# Patient Record
Sex: Female | Born: 1975 | Race: Black or African American | Hispanic: No | Marital: Married | Smoking: Never smoker
Health system: Southern US, Community
[De-identification: ages and names within clinical notes are randomized; demographics above are authoritative.]

## PROBLEM LIST (undated history)

## (undated) ENCOUNTER — Inpatient Hospital Stay (HOSPITAL_COMMUNITY): Payer: Self-pay

## (undated) DIAGNOSIS — R0602 Shortness of breath: Secondary | ICD-10-CM

## (undated) DIAGNOSIS — Z8619 Personal history of other infectious and parasitic diseases: Secondary | ICD-10-CM

## (undated) DIAGNOSIS — O139 Gestational [pregnancy-induced] hypertension without significant proteinuria, unspecified trimester: Secondary | ICD-10-CM

## (undated) DIAGNOSIS — E611 Iron deficiency: Secondary | ICD-10-CM

## (undated) DIAGNOSIS — I209 Angina pectoris, unspecified: Secondary | ICD-10-CM

## (undated) DIAGNOSIS — C801 Malignant (primary) neoplasm, unspecified: Secondary | ICD-10-CM

## (undated) DIAGNOSIS — E669 Obesity, unspecified: Secondary | ICD-10-CM

## (undated) DIAGNOSIS — K589 Irritable bowel syndrome without diarrhea: Secondary | ICD-10-CM

## (undated) DIAGNOSIS — D649 Anemia, unspecified: Secondary | ICD-10-CM

## (undated) DIAGNOSIS — I8390 Asymptomatic varicose veins of unspecified lower extremity: Secondary | ICD-10-CM

## (undated) DIAGNOSIS — O24419 Gestational diabetes mellitus in pregnancy, unspecified control: Secondary | ICD-10-CM

## (undated) HISTORY — DX: Personal history of other infectious and parasitic diseases: Z86.19

## (undated) HISTORY — DX: Gestational diabetes mellitus in pregnancy, unspecified control: O24.419

## (undated) HISTORY — DX: Anemia, unspecified: D64.9

## (undated) HISTORY — DX: Obesity, unspecified: E66.9

## (undated) HISTORY — DX: Angina pectoris, unspecified: I20.9

## (undated) HISTORY — PX: VULVA SURGERY: SHX837

## (undated) HISTORY — DX: Asymptomatic varicose veins of unspecified lower extremity: I83.90

## (undated) HISTORY — DX: Shortness of breath: R06.02

## (undated) HISTORY — DX: Iron deficiency: E61.1

## (undated) HISTORY — DX: Irritable bowel syndrome, unspecified: K58.9

---

## 2005-07-26 ENCOUNTER — Observation Stay (HOSPITAL_COMMUNITY): Admission: AD | Admit: 2005-07-26 | Discharge: 2005-07-27 | Payer: Self-pay | Admitting: Obstetrics & Gynecology

## 2005-09-12 ENCOUNTER — Ambulatory Visit (HOSPITAL_COMMUNITY): Admission: RE | Admit: 2005-09-12 | Discharge: 2005-09-12 | Payer: Self-pay | Admitting: Obstetrics & Gynecology

## 2005-12-28 ENCOUNTER — Inpatient Hospital Stay (HOSPITAL_COMMUNITY): Admission: AD | Admit: 2005-12-28 | Discharge: 2005-12-28 | Payer: Self-pay | Admitting: Obstetrics & Gynecology

## 2006-01-01 ENCOUNTER — Encounter: Payer: Self-pay | Admitting: Vascular Surgery

## 2006-01-01 ENCOUNTER — Ambulatory Visit (HOSPITAL_COMMUNITY): Admission: RE | Admit: 2006-01-01 | Discharge: 2006-01-01 | Payer: Self-pay | Admitting: Obstetrics & Gynecology

## 2006-01-14 ENCOUNTER — Inpatient Hospital Stay (HOSPITAL_COMMUNITY): Admission: AD | Admit: 2006-01-14 | Discharge: 2006-01-14 | Payer: Self-pay | Admitting: Obstetrics & Gynecology

## 2006-01-27 ENCOUNTER — Inpatient Hospital Stay (HOSPITAL_COMMUNITY): Admission: AD | Admit: 2006-01-27 | Discharge: 2006-01-29 | Payer: Self-pay | Admitting: Obstetrics & Gynecology

## 2008-01-15 ENCOUNTER — Emergency Department (HOSPITAL_COMMUNITY): Admission: EM | Admit: 2008-01-15 | Discharge: 2008-01-15 | Payer: Self-pay | Admitting: Emergency Medicine

## 2010-12-21 NOTE — H&P (Signed)
NAMETICHINA, KOEBEL NO.:  [id number]   MEDICAL RECORD NO.:  [id number]          PATIENT TYPE:  OBV   LOCATION:  9303                          FACILITY:  [facility]   PHYSICIAN:  Roseanna Rainbow, M.D.DATE OF BIRTH:  12-31-1975   DATE OF ADMISSION:  07/26/2005  DATE OF DISCHARGE:                                HISTORY & PHYSICAL   CHIEF COMPLAINT:  The patient is a 35 year old gravida 1 para 0 with an  estimated date of confinement of January 28, 2006, with an intrauterine  pregnancy of 13+ weeks, complaining of nausea, vomiting, and diarrhea.   HISTORY OF PRESENT ILLNESS:  The symptoms began several hours prior to  presentation.   PAST GYN HISTORY:  Pap smear normal, April 2006.  No sexually transmitted  infections.   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   PAST SURGICAL HISTORY:  She has had chin surgery and repair of a laceration  right flank.   DRUG ALLERGIES:  PREDNISONE.   MEDICATIONS:  Prenatal vitamins.   PHYSICAL EXAMINATION:  VITAL SIGNS:  Temperature 100.5; pulse 120;  respirations 20; blood pressure 116/75.  Orthostatics:  Orthostatic by  pulse.  GENERAL:  Nontoxic appearing.  ABDOMEN:  Soft, nontender.  PELVIC:  Deferred.   ASSESSMENT:  Early pregnant viral gastroenteritis with dehydration.   PLAN:  23-hour observation.  Will check a CBC, electrolytes.  IV hydration,  antiemetics.      Roseanna Rainbow, M.D.  Electronically Signed     LAJ/MEDQ  D:  07/27/2005  T:  07/27/2005  Job:  [id number]

## 2010-12-21 NOTE — H&P (Signed)
NAMELEYLANIE, WOODMANSEE NO.:  [id number]   MEDICAL RECORD NO.:  [id number]          PATIENT TYPE:  INP   LOCATION:  9162                          FACILITY:  [facility]   PHYSICIAN:  Roseanna Rainbow, M.D.DATE OF BIRTH:  [birth year]   DATE OF ADMISSION:  01/27/2006  DATE OF DISCHARGE:                                HISTORY & PHYSICAL   CHIEF COMPLAINT:  The patient is a 35 year old gravida 1, para 0 with an  estimated date of confinement of June 26 with an intrauterine pregnancy of  39+ weeks, complaining of ruptured membranes and contractions.   HISTORY OF PRESENT ILLNESS:  Please see the above. The patient reports  rupture of membranes with green-tinged fluid several hours to presentation.   ALLERGIES:  PREDNISONE.   MEDICATIONS:  Prenatal vitamins.   RISK FACTORS:  Gestational hypertension.   LABORATORY DATA:  Hemoglobin 11, hematocrit 33.6. Chlamydia negative. GC  negative. Urine culture and sensitivity is significant growth. GBS negative.  One-hour GTT 157. Three-hour GTT normal. HIV nonreactive. Blood type O  positive, antibody screen negative. Hepatitis B surface antigen negative.  RPR nonreactive. Rubella immune. Sickle cell negative.   PAST GYNECOLOGICAL HISTORY:  Noncontributory.   PAST MEDICAL HISTORY:  No significant history of medical diseases.   PAST SURGICAL HISTORY:  No previous surgery.   SOCIAL HISTORY:  Does not give any significant history of alcohol usage. Has  no significant smoking history. Denies illicit drug use.   FAMILY HISTORY:  Chronic hypertension, diabetes mellitus.   PHYSICAL EXAMINATION:  VITAL SIGNS:  Blood pressure 140s/80s to 90s,  afebrile, fetal heart tracing reassuring. Tocodynamometer:  Irregular  uterine contractions.  STERILE VAGINAL EXAM:  Cervix is 2 cm dilated, 80% effaced.   ASSESSMENT:  Intrauterine pregnancy at term with gestational hypertension,  meconium-stained fluid.   PLAN:  Admission. Likely  augmentation of labor with low-dose Pitocin. Will  check a PIH panel.      Roseanna Rainbow, M.D.  Electronically Signed     LAJ/MEDQ  D:  01/27/2006  T:  01/27/2006  Job:  [id number]

## 2012-03-11 ENCOUNTER — Ambulatory Visit (INDEPENDENT_AMBULATORY_CARE_PROVIDER_SITE_OTHER): Payer: Medicaid Other | Admitting: Obstetrics and Gynecology

## 2012-03-11 DIAGNOSIS — O26849 Uterine size-date discrepancy, unspecified trimester: Secondary | ICD-10-CM

## 2012-03-11 DIAGNOSIS — Z331 Pregnant state, incidental: Secondary | ICD-10-CM

## 2012-03-11 LAB — POCT URINALYSIS DIPSTICK
Bilirubin, UA: NEGATIVE
Blood, UA: NEGATIVE
Nitrite, UA: NEGATIVE
Spec Grav, UA: 1.025
Urobilinogen, UA: NEGATIVE

## 2012-03-11 NOTE — Progress Notes (Signed)
Pt declines genetic testing. Dating U/S sched at [facility] W/U 04/02/12.  Declined earlier appt.

## 2012-03-12 LAB — PRENATAL PANEL VII
Antibody Screen: NEGATIVE
Basophils Absolute: 0 10*3/uL (ref 0.0–0.1)
Basophils Relative: 1 % (ref 0–1)
Eosinophils Relative: 2 % (ref 0–5)
Hemoglobin: 11.9 g/dL — ABNORMAL LOW (ref 12.0–15.0)
MCV: 85.5 fL (ref 78.0–100.0)
Monocytes Absolute: 0.6 10*3/uL (ref 0.1–1.0)
Monocytes Relative: 9 % (ref 3–12)
RBC: 4.15 MIL/uL (ref 3.87–5.11)
WBC: 5.9 10*3/uL (ref 4.0–10.5)

## 2012-03-13 ENCOUNTER — Telehealth: Payer: Self-pay | Admitting: Obstetrics and Gynecology

## 2012-03-13 LAB — HEMOGLOBINOPATHY EVALUATION
Hgb A2 Quant: 2.6 % (ref 2.2–3.2)
Hgb A: 97.4 % (ref 96.8–97.8)
Hgb F Quant: 0 % (ref 0.0–2.0)
Hgb S Quant: 0 %

## 2012-03-13 NOTE — Telephone Encounter (Signed)
TRIAGE/OB °

## 2012-03-13 NOTE — Telephone Encounter (Signed)
Pt called, is 7 wks, states is experiencing some brown spotting today and yesterday.  Spotting started the morning after she had intercourse 2 days ago.  Pt denies any cramping, has a pad on, is not seeing any of the spotting on the pad, just when she wipes.  Per DD advised pt to continue monitoring for next few hours.  If spotting changes to bright red bleeding, starts to spill out on the pad, accompanied with cramping to call office.  Pt also advised if in the next 24 hours does not change, continuing to spot to call after hours over the weekend.  Pt voices understanding.

## 2012-03-24 ENCOUNTER — Telehealth: Payer: Self-pay | Admitting: Obstetrics and Gynecology

## 2012-03-24 NOTE — Telephone Encounter (Signed)
Pt 8-[redacted] wk gestation, called complaining of Dizziness and nausea. Pt was advised to follow protocol for nausea that was given at time of ob interview(booklet). Pt was advised to drink plenty of fluids and eat more protein. Pt had only been eating small portions of fruit and salad only, Pt was advised to call the office if symptoms worsen. Jenny Marshall

## 2012-04-02 ENCOUNTER — Ambulatory Visit (INDEPENDENT_AMBULATORY_CARE_PROVIDER_SITE_OTHER): Payer: Medicaid Other | Admitting: Obstetrics and Gynecology

## 2012-04-02 ENCOUNTER — Encounter: Payer: Self-pay | Admitting: Obstetrics and Gynecology

## 2012-04-02 ENCOUNTER — Ambulatory Visit (INDEPENDENT_AMBULATORY_CARE_PROVIDER_SITE_OTHER): Payer: Medicaid Other

## 2012-04-02 VITALS — BP 110/64 | Ht 69.0 in | Wt 238.5 lb

## 2012-04-02 DIAGNOSIS — O26849 Uterine size-date discrepancy, unspecified trimester: Secondary | ICD-10-CM

## 2012-04-02 DIAGNOSIS — R638 Other symptoms and signs concerning food and fluid intake: Secondary | ICD-10-CM | POA: Insufficient documentation

## 2012-04-02 DIAGNOSIS — K117 Disturbances of salivary secretion: Secondary | ICD-10-CM

## 2012-04-02 DIAGNOSIS — Z8632 Personal history of gestational diabetes: Secondary | ICD-10-CM

## 2012-04-02 DIAGNOSIS — N90810 Female genital mutilation status, unspecified: Secondary | ICD-10-CM | POA: Insufficient documentation

## 2012-04-02 DIAGNOSIS — O09299 Supervision of pregnancy with other poor reproductive or obstetric history, unspecified trimester: Secondary | ICD-10-CM | POA: Insufficient documentation

## 2012-04-02 DIAGNOSIS — Z331 Pregnant state, incidental: Secondary | ICD-10-CM

## 2012-04-02 LAB — OB RESULTS CONSOLE GC/CHLAMYDIA
Chlamydia: NEGATIVE
Gonorrhea: NEGATIVE

## 2012-04-02 LAB — POCT URINALYSIS DIPSTICK
Leukocytes, UA: NEGATIVE
Spec Grav, UA: >=1.03
pH, UA: 5

## 2012-04-02 LAB — US OB COMP LESS 14 WKS

## 2012-04-02 NOTE — Progress Notes (Signed)
[place] today:  10 4/7 weeks, c/w dates, SIUP.  FHR 156 Simple cyst on RV, 4.2 x 2.7 x 3.8

## 2012-04-02 NOTE — Progress Notes (Signed)
Subjective:    Jenny Marshall is being seen today for her first obstetrical visit at [redacted]w[redacted]d gestation by LMP, and is in agreement with US done today for dating. 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  Plans non-medicated birth--had severe tearing with 1st delivery, with 2 1/2 hour repair per patient.  Hopes to avoid issue this delivery.  Had episiotomy last delivery.  Tends to push very vigorously, with rapid 2nd stage labor  .  She reports nausa, vomiting, and ptyalism, but declines meds.  Also has some dizziness with position changes.  Her obstetrical history is significant for: Patient Active Problem List  Diagnosis  . Female circumcision  . Hx of gestational diabetes in prior pregnancy, currently pregnant  . Increased BMI  . Perineal/vulvar trauma with delivery  . Ptyalism    Relationship with FOB:  Married, of the Muslim faith.  Patient does intend to breast feed.   Pregnancy history fully reviewed.  The following portions of the patient's history were reviewed and updated as appropriate: allergies, current medications, past family history, past medical history, past social history, past surgical history and problem list.  Review of Systems Pertinent ROS is described in HPI   Objective:   BP 110/64  Ht 5\' 9"  (1.753 m)  Wt 238 lb 8 oz (108.183 kg)  BMI 35.22 kg/m2  LMP 01/20/2012 Wt Readings from Last 1 Encounters:  04/02/12 238 lb 8 oz (108.183 kg)   BMI: Body mass index is 35.22 kg/(m^2).  General: alert, cooperative and no distress Respiratory: clear to auscultation bilaterally Cardiovascular: regular rate and rhythm, S1, S2 normal, no murmur Breasts:  No dominant masses, nipples erect Gastrointestinal: soft, non-tender; no masses,  no organomegaly Extremities: extremities normal, no pain or edema Vaginal Bleeding: None  EXTERNAL GENITALIA: normal appearing vulva with no masses, tenderness or lesions VAGINA: no abnormal discharge or  lesions CERVIX: no lesions or cervical motion tenderness; cervix closed, long, firm UTERUS: gravid and consistent with 10-11 weeks ADNEXA: no masses palpable and nontender OB EXAM PELVIMETRY: appears adequate Thin band of tissue at previous site of clitoris.  Perineal body intact.   Introitus admits 2 fingers fully, with mild tenderness.   FHR:  160  bpm  Assessment:    Pregnancy at  10 3/7 weeks Female circumcision Hx perineal trauma with 1st birth Hx GDM last pregnancy Plan:     Prenatal panel reviewed and discussed with the patient:yes Pap smear collected:yes GC/Chlamydia collected:yes Wet prep:  WNL Discussion of Genetic testing options: Declines Prenatal vitamins recommended Problem list reviewed and updated.  Plan of care: Follow up in 4 weeks for ROB--would like to see SR at that visit. [place] for anatomy at 18 weeks Early glucola at 18 weeks Discussed issue of previous perineal trauma--reassured patient we would do all we can to assist in protecting the perineum at delivery, including evaluating need/option for episiotomy prn.  Nigel Bridgeman CNM, [place] 04/02/2012 2:44 PM

## 2012-04-02 NOTE — Progress Notes (Signed)
Pt states she had to stop PNV C/O: spitting, being dizzy, fatigue, and weakness Pt states she is drinking plenty of water 8-10 glasses a day. Last pap was 4 year ago. Pt declines genetic testing.

## 2012-04-07 ENCOUNTER — Encounter: Payer: Self-pay | Admitting: Obstetrics and Gynecology

## 2012-04-07 LAB — PAP IG, CT-NG, RFX HPV ASCU: GC Probe Amp: NEGATIVE

## 2012-04-30 ENCOUNTER — Ambulatory Visit (INDEPENDENT_AMBULATORY_CARE_PROVIDER_SITE_OTHER): Payer: Medicaid Other | Admitting: Obstetrics and Gynecology

## 2012-04-30 ENCOUNTER — Encounter: Payer: Medicaid Other | Admitting: Obstetrics and Gynecology

## 2012-04-30 VITALS — BP 110/58 | Wt 237.0 lb

## 2012-04-30 DIAGNOSIS — Z331 Pregnant state, incidental: Secondary | ICD-10-CM

## 2012-04-30 DIAGNOSIS — Z3689 Encounter for other specified antenatal screening: Secondary | ICD-10-CM

## 2012-04-30 NOTE — Progress Notes (Signed)
[redacted]w[redacted]d Prenatal labs reviewed with patient. Genetic screening discussed:quad screen declined [place] in 4 weeks with early glucola and anatomy ultrasound

## 2012-04-30 NOTE — Progress Notes (Signed)
Pt stated no issues today.  

## 2012-05-06 ENCOUNTER — Telehealth: Payer: Self-pay | Admitting: Obstetrics and Gynecology

## 2012-05-06 ENCOUNTER — Emergency Department (HOSPITAL_COMMUNITY)
Admission: EM | Admit: 2012-05-06 | Discharge: 2012-05-07 | Disposition: A | Discharge status: Home / Self Care | Payer: Medicaid Other | Attending: Emergency Medicine | Admitting: Emergency Medicine

## 2012-05-06 ENCOUNTER — Encounter (HOSPITAL_COMMUNITY): Payer: Self-pay | Admitting: *Deleted

## 2012-05-06 DIAGNOSIS — O210 Mild hyperemesis gravidarum: Secondary | ICD-10-CM | POA: Insufficient documentation

## 2012-05-06 DIAGNOSIS — N390 Urinary tract infection, site not specified: Secondary | ICD-10-CM

## 2012-05-06 DIAGNOSIS — R42 Dizziness and giddiness: Secondary | ICD-10-CM | POA: Insufficient documentation

## 2012-05-06 NOTE — ED Notes (Signed)
Pt c/o having increased dizziness x 2 wks; [redacted] wks pregnant with nausea and vomiting; increased dizziness whenever she lays down flat; c/o yeast infection

## 2012-05-06 NOTE — Telephone Encounter (Signed)
Discussed reposition slowly and lay down so you do not fall, discussed nutrition increased protein snacks, hydration, may come to [facility] for evaluation or office tomorrow. Plans office will leave message for appt. Suspect (BPPV) benign paroxysmal positional vertigo may need Epley maneuver based on symptoms.Discussed with Dr. Su Hilt to urgent care or ED or refer to PCP. Lavera Guise, CNM

## 2012-05-07 ENCOUNTER — Telehealth: Payer: Self-pay | Admitting: Obstetrics and Gynecology

## 2012-05-07 ENCOUNTER — Other Ambulatory Visit: Payer: Self-pay | Admitting: Obstetrics and Gynecology

## 2012-05-07 LAB — BASIC METABOLIC PANEL
BUN: 5 mg/dL — ABNORMAL LOW (ref 6–23)
CO2: 24 mEq/L (ref 19–32)
Calcium: 9.2 mg/dL (ref 8.4–10.5)
Chloride: 101 mEq/L (ref 96–112)
Creatinine, Ser: 0.44 mg/dL — ABNORMAL LOW (ref 0.50–1.10)
GFR calc Af Amer: >90 mL/min (ref >90)
GFR calc non Af Amer: >90 mL/min (ref >90)
Glucose, Bld: 118 mg/dL — ABNORMAL HIGH (ref 70–99)
Potassium: 3.7 mEq/L (ref 3.5–5.1)
Sodium: 133 mEq/L — ABNORMAL LOW (ref 135–145)

## 2012-05-07 LAB — CBC WITH DIFFERENTIAL/PLATELET
Basophils Absolute: 0 10*3/uL (ref 0.0–0.1)
Basophils Relative: 0 % (ref 0–1)
Eosinophils Absolute: 0.2 10*3/uL (ref 0.0–0.7)
Eosinophils Relative: 2 % (ref 0–5)
HCT: 32.8 % — ABNORMAL LOW (ref 36.0–46.0)
Hemoglobin: 11.4 g/dL — ABNORMAL LOW (ref 12.0–15.0)
Lymphocytes Relative: 38 % (ref 12–46)
Lymphs Abs: 3.4 10*3/uL (ref 0.7–4.0)
MCH: 29.7 pg (ref 26.0–34.0)
MCHC: 34.8 g/dL (ref 30.0–36.0)
MCV: 85.4 fL (ref 78.0–100.0)
Monocytes Absolute: 0.9 10*3/uL (ref 0.1–1.0)
Monocytes Relative: 10 % (ref 3–12)
Neutro Abs: 4.5 10*3/uL (ref 1.7–7.7)
Neutrophils Relative %: 51 % (ref 43–77)
Platelets: 251 10*3/uL (ref 150–400)
RBC: 3.84 MIL/uL — ABNORMAL LOW (ref 3.87–5.11)
RDW: 13.4 % (ref 11.5–15.5)
WBC: 9 10*3/uL (ref 4.0–10.5)

## 2012-05-07 LAB — URINALYSIS, ROUTINE W REFLEX MICROSCOPIC
Bilirubin Urine: NEGATIVE
Glucose, UA: 250 mg/dL — AB
Hgb urine dipstick: NEGATIVE
Ketones, ur: NEGATIVE mg/dL
Nitrite: NEGATIVE
Protein, ur: NEGATIVE mg/dL
Specific Gravity, Urine: 1.027 (ref 1.005–1.030)
Urobilinogen, UA: 0.2 mg/dL (ref 0.0–1.0)
pH: 6 (ref 5.0–8.0)

## 2012-05-07 LAB — URINE MICROSCOPIC-ADD ON

## 2012-05-07 MED ORDER — SODIUM CHLORIDE 0.9 % IV BOLUS (SEPSIS)
1000.0000 mL | Freq: Once | INTRAVENOUS | Status: AC
  Administered 2012-05-07: 1000 mL via INTRAVENOUS

## 2012-05-07 MED ORDER — MECLIZINE HCL 25 MG PO TABS: 25.0000 mg | ORAL_TABLET | Freq: Three times a day (TID) | ORAL | Status: DC | PRN

## 2012-05-07 MED ORDER — CEPHALEXIN 500 MG PO CAPS: 500.0000 mg | ORAL_CAPSULE | Freq: Four times a day (QID) | ORAL | Status: DC

## 2012-05-07 NOTE — ED Provider Notes (Signed)
History     CSN: [id number]  Arrival date & time 05/06/12  2152   First MD Initiated Contact with Patient 05/07/12 0021      Chief Complaint  Patient presents with  . Dizziness  . Morning Sickness    (Consider location/radiation/quality/duration/timing/severity/associated sxs/prior treatment) HPI The patient presents to the emergency department with a two week history of dizziness.  The patient is [redacted] weeks pregnant and reports decreased nausea and vomiting since her first trimester.  She reports daily episodes of dizziness that occur when the patient goes from sit to stand and when she puts her head down.  She describes it as spinning and "pulling her down".  She denies syncope, palpitations, chest pain, sob, tinnitus, and ear pain. The patient spoke with her GYN doctor about these symptoms and they referred her to the ER or UCC Past Medical History  Diagnosis Date  . Shortness of breath     AGE 23-21; WITH STRESS  . Irritable bowel     X SEVERAL YEARS;  RESOLVED 2006  . Varicose veins   . Anginal pain     AGE 23-21; HAD EVAL; NO DX; OCCURS WITH STRSS ONT BLOOD THINNERS AGE 18  . Anemia     CHRONIC  . Gestational diabetes     2009  . H/O varicella   . Low iron     Past Surgical History  Procedure Date  . Vulva surgery AGE 89    REMOVAL OF CLITORIS    Family History  Problem Relation Age of Onset  . Hypertension Father   . Hyperlipidemia Paternal Aunt   . Diabetes Paternal Aunt   . Hypertension Paternal Aunt   . Kidney disease Paternal Aunt   . Hyperlipidemia Paternal Uncle   . Diabetes Paternal Uncle   . Hypertension Paternal Uncle   . Stroke Paternal Uncle   . Rheum arthritis Paternal Uncle   . Arthritis Maternal Grandmother   . Diabetes Maternal Grandmother   . Hyperlipidemia Maternal Grandmother   . Other Maternal Grandmother     VARICOSE VEINS  . Heart disease Maternal Grandfather   . Vision loss Paternal Grandfather     GLAUCOMA    History  Substance  Use Topics  . Smoking status: Never Smoker   . Smokeless tobacco: Never Used  . Alcohol Use: No    OB History    Grav Para Term Preterm Abortions TAB SAB Ect Mult Living   3 2 2       2       Review of Systems All pertinent positives and negatives in the history of present illness   Allergies  Prednisone  Home Medications   Current Outpatient Rx  Name Route Sig Dispense Refill  . DOCUSATE SODIUM 100 MG PO CAPS Oral Take 100 mg by mouth daily. OTC    . PRENATAL MULTIVITAMIN CH Oral Take 1 tablet by mouth daily.      BP 110/84  Pulse 91  Temp 99.1 F (37.3 C) (Oral)  Wt 244 lb 4 oz (110.791 kg)  SpO2 100%  LMP 01/20/2012  Physical Exam  Constitutional: She is oriented to person, place, and time. She appears well-developed and well-nourished. No distress.  HENT:  Head: Normocephalic and atraumatic.  Right Ear: External ear normal.  Left Ear: External ear normal.  Mouth/Throat: Oropharynx is clear and moist.  Eyes: Conjunctivae normal and EOM are normal. Pupils are equal, round, and reactive to light.  Neck: Normal range of motion. Neck supple.  Cardiovascular: Normal rate, regular rhythm, normal heart sounds and intact distal pulses.   Pulmonary/Chest: Effort normal and breath sounds normal. No respiratory distress. She has no wheezes. She has no rales. She exhibits no tenderness.  Abdominal: Soft. Bowel sounds are normal.  Neurological: She is alert and oriented to person, place, and time.    ED Course  Procedures (including critical care time)  Labs Reviewed  CBC WITH DIFFERENTIAL - Abnormal; Notable for the following:    RBC 3.84 (*)     Hemoglobin 11.4 (*)     HCT 32.8 (*)     All other components within normal limits  BASIC METABOLIC PANEL - Abnormal; Notable for the following:    Sodium 133 (*)     Glucose, Bld 118 (*)     BUN 5 (*)     Creatinine, Ser 0.44 (*)     All other components within normal limits  URINALYSIS, ROUTINE W REFLEX MICROSCOPIC -  Abnormal; Notable for the following:    APPearance CLOUDY (*)     Glucose, UA 250 (*)     Leukocytes, UA MODERATE (*)     All other components within normal limits  URINE MICROSCOPIC-ADD ON - Abnormal; Notable for the following:    Squamous Epithelial / LPF FEW (*)     Bacteria, UA MANY (*)     All other components within normal limits   The patient will be referred back to her GYN. Patient does report feeling better. The patient her vertigo like symptoms. Will treat for UTI as well. Told to return here as needed. Increase her fluid intake.  MDM  MDM Reviewed: vitals, nursing note and previous chart Interpretation: labs            Carlyle Dolly, PA-C 05/07/12 [phone]

## 2012-05-07 NOTE — Telephone Encounter (Signed)
Tc from pt. Told pt was dx with UTI per UA and microscopic results. Pt also aware will be receiving a cb in regards to ENT referral. Pt agrees.

## 2012-05-07 NOTE — Telephone Encounter (Signed)
Tc to pt per telephone call. Lm on vm to cb. 

## 2012-05-07 NOTE — ED Provider Notes (Signed)
Medical screening examination/treatment/procedure(s) were performed by non-physician practitioner and as supervising physician I was immediately available for consultation/collaboration.   Indiyah Paone B. Bernette Mayers, MD 05/07/12 [phone]

## 2012-05-08 ENCOUNTER — Telehealth: Payer: Self-pay | Admitting: Obstetrics and Gynecology

## 2012-05-08 ENCOUNTER — Other Ambulatory Visit: Payer: Self-pay

## 2012-05-08 ENCOUNTER — Inpatient Hospital Stay (HOSPITAL_COMMUNITY)
Admission: AD | Admit: 2012-05-08 | Discharge: 2012-05-08 | Disposition: A | Discharge status: Home / Self Care | Payer: Medicaid Other | Source: Ambulatory Visit | Attending: Obstetrics and Gynecology | Admitting: Obstetrics and Gynecology

## 2012-05-08 ENCOUNTER — Encounter (HOSPITAL_COMMUNITY): Payer: Self-pay | Admitting: *Deleted

## 2012-05-08 DIAGNOSIS — R000 Tachycardia, unspecified: Secondary | ICD-10-CM | POA: Insufficient documentation

## 2012-05-08 DIAGNOSIS — N90810 Female genital mutilation status, unspecified: Secondary | ICD-10-CM

## 2012-05-08 DIAGNOSIS — R0602 Shortness of breath: Secondary | ICD-10-CM

## 2012-05-08 DIAGNOSIS — K117 Disturbances of salivary secretion: Secondary | ICD-10-CM

## 2012-05-08 DIAGNOSIS — O99019 Anemia complicating pregnancy, unspecified trimester: Secondary | ICD-10-CM | POA: Insufficient documentation

## 2012-05-08 DIAGNOSIS — R55 Syncope and collapse: Secondary | ICD-10-CM

## 2012-05-08 DIAGNOSIS — Z331 Pregnant state, incidental: Secondary | ICD-10-CM

## 2012-05-08 DIAGNOSIS — R638 Other symptoms and signs concerning food and fluid intake: Secondary | ICD-10-CM

## 2012-05-08 DIAGNOSIS — O2390 Unspecified genitourinary tract infection in pregnancy, unspecified trimester: Secondary | ICD-10-CM | POA: Insufficient documentation

## 2012-05-08 DIAGNOSIS — IMO0002 Reserved for concepts with insufficient information to code with codable children: Secondary | ICD-10-CM | POA: Insufficient documentation

## 2012-05-08 DIAGNOSIS — R42 Dizziness and giddiness: Secondary | ICD-10-CM

## 2012-05-08 DIAGNOSIS — D649 Anemia, unspecified: Secondary | ICD-10-CM | POA: Insufficient documentation

## 2012-05-08 DIAGNOSIS — Z8632 Personal history of gestational diabetes: Secondary | ICD-10-CM

## 2012-05-08 DIAGNOSIS — O09299 Supervision of pregnancy with other poor reproductive or obstetric history, unspecified trimester: Secondary | ICD-10-CM

## 2012-05-08 DIAGNOSIS — N390 Urinary tract infection, site not specified: Secondary | ICD-10-CM | POA: Insufficient documentation

## 2012-05-08 LAB — CBC WITH DIFFERENTIAL/PLATELET
Eosinophils Absolute: 0.1 10*3/uL (ref 0.0–0.7)
HCT: 34.4 % — ABNORMAL LOW (ref 36.0–46.0)
Lymphocytes Relative: 28 % (ref 12–46)
Lymphs Abs: 2.4 10*3/uL (ref 0.7–4.0)
Monocytes Relative: 8 % (ref 3–12)
Neutro Abs: 5.3 10*3/uL (ref 1.7–7.7)
RBC: 3.96 MIL/uL (ref 3.87–5.11)
WBC: 8.4 10*3/uL (ref 4.0–10.5)

## 2012-05-08 LAB — COMPREHENSIVE METABOLIC PANEL
AST: 14 U/L (ref 0–37)
Albumin: 2.7 g/dL — ABNORMAL LOW (ref 3.5–5.2)
BUN: 4 mg/dL — ABNORMAL LOW (ref 6–23)
Chloride: 99 mEq/L (ref 96–112)
Creatinine, Ser: 0.42 mg/dL — ABNORMAL LOW (ref 0.50–1.10)
GFR calc Af Amer: >90 mL/min (ref >90)
GFR calc non Af Amer: >90 mL/min (ref >90)
Total Protein: 6.8 g/dL (ref 6.0–8.3)

## 2012-05-08 LAB — URINALYSIS, ROUTINE W REFLEX MICROSCOPIC
Bilirubin Urine: NEGATIVE
Hgb urine dipstick: NEGATIVE
Protein, ur: NEGATIVE mg/dL
pH: 5.5 (ref 5.0–8.0)

## 2012-05-08 MED ORDER — ONDANSETRON 4 MG PO TBDP
4.0000 mg | ORAL_TABLET | Freq: Once | ORAL | Status: AC
  Administered 2012-05-08: 4 mg via ORAL
  Filled 2012-05-08: qty 1

## 2012-05-08 MED ORDER — ONDANSETRON 4 MG PO TBDP: 4.0000 mg | ORAL_TABLET | Freq: Three times a day (TID) | ORAL | Status: DC | PRN

## 2012-05-08 MED ORDER — NITROFURANTOIN MONOHYD MACRO 100 MG PO CAPS: 100.0000 mg | ORAL_CAPSULE | Freq: Two times a day (BID) | ORAL | Status: DC

## 2012-05-08 NOTE — Telephone Encounter (Signed)
Message copied by Mason Jim on Fri May 08, 2012  8:52 AM ------      Message from: Lerry Liner D      Created: Thu May 07, 2012 11:38 AM       Per Jearld Lesch, CNM. Pt needs referral to ENT due to persistant dizziness x 2wks. No ear pain. Wants to r/o BPPV.

## 2012-05-08 NOTE — MAU Provider Note (Signed)
History    The patient called today complaining of Syncope and breathlessness with fatigue. Evaluation of her condition in MAU.  CSN: [id number]  Arrival date and time: 05/08/12 1210   None     Chief Complaint  Patient presents with  . Shortness of Breath  . Tachycardia  . Fatigue   HPI  OB History    Grav Para Term Preterm Abortions TAB SAB Ect Mult Living   3 2 2       2       Past Medical History  Diagnosis Date  . Shortness of breath     AGE 34-34; WITH STRESS  . Irritable bowel     X SEVERAL YEARS;  RESOLVED 2006  . Varicose veins   . Anginal pain     AGE 34-34; HAD EVAL; NO DX; OCCURS WITH STRSS ONT BLOOD THINNERS AGE 76  . Anemia     CHRONIC  . Gestational diabetes     2009  . H/O varicella   . Low iron     Past Surgical History  Procedure Date  . Vulva surgery AGE 36    REMOVAL OF CLITORIS    Family History  Problem Relation Age of Onset  . Hypertension Father   . Hyperlipidemia Paternal Aunt   . Diabetes Paternal Aunt   . Hypertension Paternal Aunt   . Kidney disease Paternal Aunt   . Hyperlipidemia Paternal Uncle   . Diabetes Paternal Uncle   . Hypertension Paternal Uncle   . Stroke Paternal Uncle   . Rheum arthritis Paternal Uncle   . Arthritis Maternal Grandmother   . Diabetes Maternal Grandmother   . Hyperlipidemia Maternal Grandmother   . Other Maternal Grandmother     VARICOSE VEINS  . Heart disease Maternal Grandfather   . Vision loss Paternal Grandfather     GLAUCOMA    History  Substance Use Topics  . Smoking status: Never Smoker   . Smokeless tobacco: Never Used  . Alcohol Use: No    Allergies:  Allergies  Allergen Reactions  . Prednisone     PRICKLY FEELING;     Prescriptions prior to admission  Medication Sig Dispense Refill  . docusate sodium (COLACE) 100 MG capsule Take 100 mg by mouth daily. OTC      . Prenatal Vit-Fe Fumarate-FA (PRENATAL MULTIVITAMIN) TABS Take 1 tablet by mouth daily.      . cephALEXin  (KEFLEX) 500 MG capsule Take 1 capsule (500 mg total) by mouth 4 (four) times daily.  28 capsule  0  . meclizine (ANTIVERT) 25 MG tablet Take 1 tablet (25 mg total) by mouth 3 (three) times daily as needed.  21 tablet  0    Review of Systems  HENT: Negative.   Eyes: Negative.   Respiratory: Positive for shortness of breath.        SOB at intervals  Cardiovascular:       Patient has stated "had breathlessness and tachycardia"  Genitourinary: Negative.   Musculoskeletal: Negative.   Skin: Negative.   Neurological: Negative.   Endo/Heme/Allergies: Negative.    Physical Exam   Blood pressure 124/80, pulse 116, temperature 97.9 F (36.6 C), temperature source Oral, resp. rate 22, height 5\' 8"  (1.727 m), weight 241 lb (109.317 kg), last menstrual period 01/20/2012, SpO2 100.00%.  Physical Exam  Constitutional: She is oriented to person, place, and time. She appears well-developed and well-nourished.  HENT:  Head: Normocephalic and atraumatic.  Eyes: Conjunctivae normal are normal. Pupils are  equal, round, and reactive to light.  Neck: Normal range of motion. Neck supple.  Cardiovascular: Normal rate, regular rhythm and normal heart sounds.   Respiratory: Effort normal.  GI: Soft. Bowel sounds are normal.  Genitourinary: Vagina normal and uterus normal.  Musculoskeletal: Normal range of motion.  Neurological: She is alert and oriented to person, place, and time. She has normal reflexes.  Skin: Skin is warm and dry.  Psychiatric: She has a normal mood and affect.    MAU Course  Procedures  EKG CBC with Diff CMP U/a Microscopy - had been given Keflex for UTI but no culture has ben sent. Zofran 4 ODT for nausea    Assessment and Plan  EKG normal CBC: mild anemia - taking PNV CMP: normal FHT's 160 bpm Urine to lab for culture Macrobid 100 mgs po BID x 7 days for UTI Zofran 4 mgs ODT  q8 hrly PRN F/u [facility] 05/28/12 Early Glucola at 18 weeks.  Ladaja Yusupov,  CNM. 05/08/2012, 1:21 PM

## 2012-05-08 NOTE — Telephone Encounter (Signed)
TC to [place] at [facility] 05/07/12. Requested records to schedule appt, Records faxed.

## 2012-05-08 NOTE — Telephone Encounter (Signed)
TC to pt. Informed of appt at [facility] ENT with Dr Conley Simmonds 05/13/12 at 2:00, arrive at 1:45. To bring Medicaid card, co-pay and list of meds. Pt then states awoke and 10:00 am and ate grapes, eggs, and tea for breakfast. Since she woke up has had tachycardia and shortness of breath (audible on phone) which has never occurred before. Per DD pt to MAU. Pt states husband will take her.

## 2012-05-08 NOTE — MAU Note (Signed)
Has been really fatigued. Woke up after sleeping for 12 hrs, still has no energy, and any activity causes shortness of breath. Tends to have a pulse over 90; has been feeling a race at times.  Was admitted to [facility] , was discharged yesterday morning: UTI and 'spinning sensation'.

## 2012-05-28 ENCOUNTER — Encounter: Payer: Self-pay | Admitting: Obstetrics and Gynecology

## 2012-05-28 ENCOUNTER — Ambulatory Visit (INDEPENDENT_AMBULATORY_CARE_PROVIDER_SITE_OTHER): Payer: Medicaid Other

## 2012-05-28 ENCOUNTER — Ambulatory Visit (INDEPENDENT_AMBULATORY_CARE_PROVIDER_SITE_OTHER): Payer: Medicaid Other | Admitting: Obstetrics and Gynecology

## 2012-05-28 ENCOUNTER — Encounter: Payer: Medicaid Other | Admitting: Obstetrics and Gynecology

## 2012-05-28 VITALS — BP 114/72 | Wt 246.0 lb

## 2012-05-28 DIAGNOSIS — O09299 Supervision of pregnancy with other poor reproductive or obstetric history, unspecified trimester: Secondary | ICD-10-CM

## 2012-05-28 DIAGNOSIS — N90810 Female genital mutilation status, unspecified: Secondary | ICD-10-CM

## 2012-05-28 DIAGNOSIS — Z8632 Personal history of gestational diabetes: Secondary | ICD-10-CM

## 2012-05-28 DIAGNOSIS — Z3689 Encounter for other specified antenatal screening: Secondary | ICD-10-CM

## 2012-05-28 DIAGNOSIS — R638 Other symptoms and signs concerning food and fluid intake: Secondary | ICD-10-CM

## 2012-05-28 NOTE — Progress Notes (Signed)
18w 3d   Ultrasound shows:  EFW 9 oz %ILE 51st       [place] EDD: 10/25/12             AFI: nl            Cervical length: 3.5 cm            Placenta localization: posterior            Fetal presentation: cephalic    Anatomy survey completed yes    Anatomy survey is normal            Gender : female    Comments:nl ovaries Hx GDM prior preg.  1 hr glu 1 wk

## 2012-05-28 NOTE — Progress Notes (Signed)
[redacted]w[redacted]d

## 2012-05-29 LAB — US OB COMP + 14 WK

## 2012-06-04 ENCOUNTER — Other Ambulatory Visit: Payer: Medicaid Other

## 2012-06-04 DIAGNOSIS — O09299 Supervision of pregnancy with other poor reproductive or obstetric history, unspecified trimester: Secondary | ICD-10-CM

## 2012-06-04 NOTE — Progress Notes (Signed)
Pt presented for 1 hr glucola.  50 gm glucose given.  Urine Trace protein, Neg glucose.

## 2012-06-09 ENCOUNTER — Telehealth: Payer: Self-pay | Admitting: Obstetrics and Gynecology

## 2012-06-09 NOTE — Telephone Encounter (Signed)
Spoke with pt rgd msg. Advised pt that she needs a 3 hour gtt.per [facility] . Karren Burly stated she would consult with [facility] to ensure about the 3 hour GTT. Advised pt would contact her back . Pt's voice understanding. bt cma

## 2012-06-09 NOTE — Telephone Encounter (Signed)
Spoke with pt rgd labs. Per [facility] advised me to have pt hold off until aout 26 weeks and to retest pt at that time . Pt's voice understanding

## 2012-06-25 ENCOUNTER — Encounter: Payer: Medicaid Other | Admitting: Obstetrics and Gynecology

## 2012-06-25 ENCOUNTER — Encounter: Payer: Self-pay | Admitting: Obstetrics and Gynecology

## 2012-06-25 ENCOUNTER — Ambulatory Visit (INDEPENDENT_AMBULATORY_CARE_PROVIDER_SITE_OTHER): Payer: Medicaid Other | Admitting: Obstetrics and Gynecology

## 2012-06-25 VITALS — BP 102/60 | Wt 248.0 lb

## 2012-06-25 DIAGNOSIS — L293 Anogenital pruritus, unspecified: Secondary | ICD-10-CM

## 2012-06-25 DIAGNOSIS — O26839 Pregnancy related renal disease, unspecified trimester: Secondary | ICD-10-CM

## 2012-06-25 DIAGNOSIS — O1210 Gestational proteinuria, unspecified trimester: Secondary | ICD-10-CM

## 2012-06-25 DIAGNOSIS — N898 Other specified noninflammatory disorders of vagina: Secondary | ICD-10-CM

## 2012-06-25 DIAGNOSIS — Z331 Pregnant state, incidental: Secondary | ICD-10-CM

## 2012-06-25 LAB — POCT WET PREP (WET MOUNT)
Clue Cells Wet Prep Whiff POC: NEGATIVE
pH: 4.5

## 2012-06-25 MED ORDER — NYSTATIN-TRIAMCINOLONE 100000-0.1 UNIT/GM-% EX OINT: TOPICAL_OINTMENT | Freq: Three times a day (TID) | CUTANEOUS | Status: DC | PRN

## 2012-06-25 NOTE — Progress Notes (Signed)
[redacted]w[redacted]d Pt states she is dry all over body starting to itch Pt c/o white discharge x 2 months and more  irritable and itchy has no odor

## 2012-06-25 NOTE — Progress Notes (Signed)
[redacted]w[redacted]d GFM Milky discharge with itching:wet prep negative. Prescribed Mycolog II ointment Proteinuria: will order 24 hour urine

## 2012-07-23 ENCOUNTER — Other Ambulatory Visit: Payer: Medicaid Other

## 2012-07-23 ENCOUNTER — Ambulatory Visit (INDEPENDENT_AMBULATORY_CARE_PROVIDER_SITE_OTHER): Payer: Medicaid Other | Admitting: Obstetrics and Gynecology

## 2012-07-23 ENCOUNTER — Encounter: Payer: Self-pay | Admitting: Obstetrics and Gynecology

## 2012-07-23 ENCOUNTER — Telehealth: Payer: Self-pay

## 2012-07-23 VITALS — BP 110/68 | Wt 252.0 lb

## 2012-07-23 DIAGNOSIS — R809 Proteinuria, unspecified: Secondary | ICD-10-CM

## 2012-07-23 DIAGNOSIS — O1210 Gestational proteinuria, unspecified trimester: Secondary | ICD-10-CM

## 2012-07-23 DIAGNOSIS — O26839 Pregnancy related renal disease, unspecified trimester: Secondary | ICD-10-CM

## 2012-07-23 DIAGNOSIS — O99810 Abnormal glucose complicating pregnancy: Secondary | ICD-10-CM

## 2012-07-23 DIAGNOSIS — O24419 Gestational diabetes mellitus in pregnancy, unspecified control: Secondary | ICD-10-CM

## 2012-07-23 NOTE — Telephone Encounter (Signed)
RX CALLED INTO PHARMACY FOR GLUCOMETER, STRIPS, AND LANCETS. ADVISED PHARMACY THAT PT COULD HAVE WHICH EVER METER WAS COVERED BY [place] MEDICAID.   Darien Ramus, CMA

## 2012-07-23 NOTE — Progress Notes (Signed)
[redacted]w[redacted]d  Pt has no complaints  Pt had 2+ urine glucose  Fingerstick done 203 Per Dr Estanislado Pandy will do 3 hr GTT with CBC and RPR

## 2012-07-23 NOTE — Progress Notes (Signed)
[redacted]w[redacted]d GFM Last visit had 1+ protein: brought 24 hour urine today Reviewed with pt today likely GDM again: pt chooses to start testing and diet. Will not pursue 3 hr GTT S>D ultrasound at [facility]

## 2012-07-27 ENCOUNTER — Telehealth: Payer: Self-pay | Admitting: Obstetrics and Gynecology

## 2012-07-27 NOTE — Telephone Encounter (Signed)
VM from pt. Needs RX for diabetic supplies to [facility].  Pt [phone]

## 2012-07-27 NOTE — Telephone Encounter (Signed)
LVM for pt to advise that i would call pharmacy again with that order.   Pharmacy called and rx left with them again. MCD will cover accu-check aviva. Will get ready for pt with 6 rfs.   Darien Ramus, CMA

## 2012-08-05 DIAGNOSIS — O24419 Gestational diabetes mellitus in pregnancy, unspecified control: Secondary | ICD-10-CM

## 2012-08-05 HISTORY — DX: Gestational diabetes mellitus in pregnancy, unspecified control: O24.419

## 2012-08-20 ENCOUNTER — Encounter: Payer: Self-pay | Admitting: Obstetrics and Gynecology

## 2012-08-20 ENCOUNTER — Ambulatory Visit: Payer: Medicaid Other

## 2012-08-20 ENCOUNTER — Ambulatory Visit: Payer: Medicaid Other | Admitting: Obstetrics and Gynecology

## 2012-08-20 VITALS — BP 100/64 | Wt 252.0 lb

## 2012-08-20 DIAGNOSIS — O24419 Gestational diabetes mellitus in pregnancy, unspecified control: Secondary | ICD-10-CM

## 2012-08-20 DIAGNOSIS — O1210 Gestational proteinuria, unspecified trimester: Secondary | ICD-10-CM

## 2012-08-20 MED ORDER — GLYBURIDE 5 MG PO TABS: 2.5000 mg | ORAL_TABLET | Freq: Every day | ORAL | Status: DC

## 2012-08-20 NOTE — Progress Notes (Signed)
[redacted]w[redacted]d Pt has no complaints  FHR range began at 160 BPM Decel observed  To 114 BPM then rapid return to 158 BPM

## 2012-08-20 NOTE — Progress Notes (Signed)
[redacted]w[redacted]d GDM: all FBS around 125-140, 2 hour PC <130. Recommend to start Glyburide 2.5 mg at dinner Follow-up 1 week with BPP

## 2012-08-20 NOTE — Progress Notes (Signed)
Ultrasound shows:  SIUP  S=D     [place] EDD: 10/04/12           EFW: 5 lb 5 oz 90th%           AFI: 16.8           Cervical length: 3.19 cm           Placenta localization: posterior           Fetal presentation: vertex Normal fluid FHR range began at 160 -decel observed-rate to 114 then rapid return to 158 GFM. Fetal breathing for 30 seconds observed.

## 2012-08-21 LAB — US OB FOLLOW UP

## 2012-08-28 ENCOUNTER — Ambulatory Visit: Payer: Medicaid Other

## 2012-08-28 ENCOUNTER — Other Ambulatory Visit: Payer: Self-pay

## 2012-08-28 ENCOUNTER — Encounter: Payer: Self-pay | Admitting: Obstetrics and Gynecology

## 2012-08-28 ENCOUNTER — Ambulatory Visit: Payer: Medicaid Other | Admitting: Obstetrics and Gynecology

## 2012-08-28 VITALS — BP 94/62 | Wt 255.0 lb

## 2012-08-28 DIAGNOSIS — Q649 Congenital malformation of urinary system, unspecified: Secondary | ICD-10-CM

## 2012-08-28 DIAGNOSIS — O1210 Gestational proteinuria, unspecified trimester: Secondary | ICD-10-CM

## 2012-08-28 DIAGNOSIS — O24419 Gestational diabetes mellitus in pregnancy, unspecified control: Secondary | ICD-10-CM

## 2012-08-28 DIAGNOSIS — O99810 Abnormal glucose complicating pregnancy: Secondary | ICD-10-CM

## 2012-08-28 LAB — POCT URINALYSIS DIPSTICK
Bilirubin, UA: NEGATIVE
Blood, UA: NEGATIVE
Leukocytes, UA: NEGATIVE
Spec Grav, UA: 1.015
pH, UA: 5

## 2012-08-28 LAB — OB RESULTS CONSOLE GBS: GBS: POSITIVE

## 2012-08-28 NOTE — Progress Notes (Signed)
[redacted]w[redacted]d U/s - BPP 8/8, nl fluid, cvx 3.59cm, fundal placenta, breech Repeat BPP in 1wk for GDM on glyburide Fastings still inc but after meals are good - will add 2.5mg  glyburide in am to regimen which is already 5mg  qhs (rec change to with dinner instead) Results for orders placed in visit on 08/28/12  POCT URINALYSIS DIPSTICK      Component Value Range   Color, UA yellow     Clarity, UA clr     Glucose, UA neg     Bilirubin, UA neg     Ketones, UA mod     Spec Grav, UA 1.015     Blood, UA neg     pH, UA 5.0     Protein, UA 3+     Urobilinogen, UA negative     Nitrite, UA neg     Leukocytes, UA Negative     If UCx is neg, I rec a repeat 24hr urine for total protein. No s/sxs of preeclampsia.

## 2012-08-30 ENCOUNTER — Telehealth: Payer: Self-pay | Admitting: Obstetrics and Gynecology

## 2012-08-30 NOTE — Telephone Encounter (Signed)
TC from patient--32 weeks, had 3 UCs in 30 min.  No bleeding or leaking, +FM. Push fluids, rest --call me in 1 hour with update. No hx PTL.

## 2012-08-30 NOTE — Telephone Encounter (Signed)
TC in f/u from previous TC call at 11:30am for several contractions. UCs have now resolved. No other sx.  +FM. CTO, call prn.

## 2012-08-31 ENCOUNTER — Other Ambulatory Visit: Payer: Self-pay

## 2012-08-31 DIAGNOSIS — O99810 Abnormal glucose complicating pregnancy: Secondary | ICD-10-CM

## 2012-09-01 ENCOUNTER — Other Ambulatory Visit: Payer: Self-pay

## 2012-09-01 ENCOUNTER — Telehealth: Payer: Self-pay

## 2012-09-01 DIAGNOSIS — R809 Proteinuria, unspecified: Secondary | ICD-10-CM

## 2012-09-01 NOTE — Telephone Encounter (Signed)
Called pt to let her know that [facility] wants her to do a 24 hr urine collection. Pt lives in [place], so she states she will start it tomorrow, when she has her appt with Victorino Dike, so that she doesn't have to make another trip here today. I will make sure that Victorino Dike knows that she needs to be given collection supplies at her visit tomorrow. Melody Comas A

## 2012-09-02 ENCOUNTER — Other Ambulatory Visit: Payer: Medicaid Other

## 2012-09-02 ENCOUNTER — Encounter: Payer: Medicaid Other | Admitting: Certified Nurse Midwife

## 2012-09-02 ENCOUNTER — Telehealth: Payer: Self-pay

## 2012-09-08 ENCOUNTER — Ambulatory Visit: Payer: Medicaid Other | Admitting: Obstetrics and Gynecology

## 2012-09-08 ENCOUNTER — Other Ambulatory Visit: Payer: Medicaid Other

## 2012-09-08 ENCOUNTER — Other Ambulatory Visit: Payer: Self-pay

## 2012-09-08 ENCOUNTER — Encounter: Payer: Self-pay | Admitting: Obstetrics and Gynecology

## 2012-09-08 ENCOUNTER — Ambulatory Visit: Payer: Medicaid Other

## 2012-09-08 VITALS — BP 112/74 | Wt 256.0 lb

## 2012-09-08 DIAGNOSIS — O1210 Gestational proteinuria, unspecified trimester: Secondary | ICD-10-CM

## 2012-09-08 DIAGNOSIS — O99810 Abnormal glucose complicating pregnancy: Secondary | ICD-10-CM

## 2012-09-08 DIAGNOSIS — O139 Gestational [pregnancy-induced] hypertension without significant proteinuria, unspecified trimester: Secondary | ICD-10-CM

## 2012-09-08 DIAGNOSIS — O24419 Gestational diabetes mellitus in pregnancy, unspecified control: Secondary | ICD-10-CM

## 2012-09-08 NOTE — Addendum Note (Signed)
Addended by: Tim Lair on: 09/08/2012 05:16 PM   Modules accepted: Orders

## 2012-09-08 NOTE — Progress Notes (Signed)
[redacted]w[redacted]d BPP today Pt has no complaints

## 2012-09-08 NOTE — Progress Notes (Signed)
[redacted]w[redacted]d The patient has gestational diabetes.  She is currently taking glyburide 5 mg at night and 2.5 mg in the morning.  Her blood sugars are now completely normal. Ultrasound today: Single gestation, normal fluid, vertex, 7 lbs. 5 oz. (97th percentile), biophysical profile 8 out of 8. Macrosomia discussed. Return to office in 1 week. Biophysical profile next week. Dr. Stefano Gaul

## 2012-09-15 ENCOUNTER — Other Ambulatory Visit: Payer: Medicaid Other

## 2012-09-16 ENCOUNTER — Encounter: Payer: Medicaid Other | Admitting: Obstetrics and Gynecology

## 2012-09-16 ENCOUNTER — Other Ambulatory Visit: Payer: Medicaid Other

## 2012-09-22 ENCOUNTER — Other Ambulatory Visit: Payer: Medicaid Other

## 2012-09-24 ENCOUNTER — Encounter: Payer: Medicaid Other | Admitting: Obstetrics and Gynecology

## 2012-09-24 ENCOUNTER — Ambulatory Visit: Payer: Medicaid Other

## 2012-09-24 ENCOUNTER — Other Ambulatory Visit: Payer: Medicaid Other

## 2012-09-24 ENCOUNTER — Ambulatory Visit: Payer: Medicaid Other | Admitting: Obstetrics and Gynecology

## 2012-09-24 VITALS — BP 122/90 | Wt 268.0 lb

## 2012-09-24 DIAGNOSIS — O24419 Gestational diabetes mellitus in pregnancy, unspecified control: Secondary | ICD-10-CM

## 2012-09-24 NOTE — Progress Notes (Signed)
[redacted]w[redacted]d BPP 8/8 Frank breech Ant placenta Nl fluid  Breech - options discussed ECV vs Prim C/S - pt will let us know at NV All CBGs are good cnt 5mg  glyburide at night and 2.5 in am GBS at NV RTO 1wk for ROB, BPP and EFW for GDM

## 2012-09-24 NOTE — Progress Notes (Signed)
Pt c/o "lump" in lower abdominal area that is sore. Also states that heartburn is horrible and nothing helps much. HAs are increasing. GDM Bpp 8/8 in 10 min Breech presentation. AFI normal 170.4cm normal at 65th%tile 12 lb weight gain. FBS this am at 7:00 = 88

## 2012-09-28 ENCOUNTER — Other Ambulatory Visit: Payer: Self-pay | Admitting: Obstetrics and Gynecology

## 2012-09-28 DIAGNOSIS — O99810 Abnormal glucose complicating pregnancy: Secondary | ICD-10-CM

## 2012-09-28 LAB — US OB FOLLOW UP

## 2012-10-01 ENCOUNTER — Ambulatory Visit: Payer: Medicaid Other

## 2012-10-01 ENCOUNTER — Ambulatory Visit: Payer: Medicaid Other | Admitting: Family Medicine

## 2012-10-01 ENCOUNTER — Other Ambulatory Visit: Payer: Self-pay | Admitting: Obstetrics and Gynecology

## 2012-10-01 VITALS — BP 134/92 | Wt 270.0 lb

## 2012-10-01 DIAGNOSIS — Z3490 Encounter for supervision of normal pregnancy, unspecified, unspecified trimester: Secondary | ICD-10-CM

## 2012-10-01 DIAGNOSIS — O24419 Gestational diabetes mellitus in pregnancy, unspecified control: Secondary | ICD-10-CM

## 2012-10-01 LAB — US OB FOLLOW UP

## 2012-10-01 NOTE — Progress Notes (Signed)
[redacted]w[redacted]d S: Doing well.  Would like to delivery vaginal if  baby turns to vertex.   O: No BS log, encouraged patient to bring to all visit b/c assist providers in decision making process about medications/treatments. A: Discussed U/S and concerns over fetal weight and risk with vaginal delivery may include a shoulder dystocia. Pt voiced understanding but would still like to try vaginal delivery no matter what.  P: Pregnancy     Continue glyburide as ordered, bring log to all appointments.     ROB in 1 week with BPP/MD visit.  Discussed with Dr. Normand Sloop     ROB in 2 weeks for Growth/MD visit.     GBS collected.      L.Bowe Sidor, FNP-BC

## 2012-10-01 NOTE — Progress Notes (Signed)
[redacted]w[redacted]d GBS today. Pt did not bring copy of CBG'S, states all readings have been normal. Ultrasound shows:  SIUP  S=D     [place] EDD: 09/28/2012           AFI: 19.0 cm =70%tile Note: Macrosmic measurements:>90th% tile AC measurement is > 42 wks 5120grams=11.2lbs            Cervical length: Not measured            Placenta localization: anterior           Fetal presentation: transverse/Head maternal right                   Anatomy survey is normal

## 2012-10-03 ENCOUNTER — Inpatient Hospital Stay (HOSPITAL_COMMUNITY): Payer: Medicaid Other

## 2012-10-03 ENCOUNTER — Other Ambulatory Visit: Payer: Self-pay | Admitting: Obstetrics and Gynecology

## 2012-10-03 ENCOUNTER — Telehealth: Payer: Self-pay | Admitting: Obstetrics and Gynecology

## 2012-10-03 ENCOUNTER — Encounter (HOSPITAL_COMMUNITY): Payer: Self-pay | Admitting: Obstetrics and Gynecology

## 2012-10-03 ENCOUNTER — Inpatient Hospital Stay (HOSPITAL_COMMUNITY)
Admission: AD | Admit: 2012-10-03 | Discharge: 2012-10-09 | DRG: 766 | Disposition: A | Discharge status: Home / Self Care | Payer: Medicaid Other | Source: Ambulatory Visit | Attending: Obstetrics and Gynecology | Admitting: Obstetrics and Gynecology

## 2012-10-03 DIAGNOSIS — N90810 Female genital mutilation status, unspecified: Secondary | ICD-10-CM | POA: Diagnosis present

## 2012-10-03 DIAGNOSIS — O322XX0 Maternal care for transverse and oblique lie, not applicable or unspecified: Secondary | ICD-10-CM | POA: Diagnosis present

## 2012-10-03 DIAGNOSIS — IMO0002 Reserved for concepts with insufficient information to code with codable children: Secondary | ICD-10-CM | POA: Insufficient documentation

## 2012-10-03 DIAGNOSIS — O1490 Unspecified pre-eclampsia, unspecified trimester: Secondary | ICD-10-CM | POA: Diagnosis present

## 2012-10-03 DIAGNOSIS — Z22330 Carrier of Group B streptococcus: Secondary | ICD-10-CM

## 2012-10-03 DIAGNOSIS — O3660X0 Maternal care for excessive fetal growth, unspecified trimester, not applicable or unspecified: Secondary | ICD-10-CM | POA: Diagnosis present

## 2012-10-03 DIAGNOSIS — O99814 Abnormal glucose complicating childbirth: Secondary | ICD-10-CM | POA: Diagnosis present

## 2012-10-03 DIAGNOSIS — O99892 Other specified diseases and conditions complicating childbirth: Secondary | ICD-10-CM | POA: Diagnosis present

## 2012-10-03 DIAGNOSIS — D649 Anemia, unspecified: Secondary | ICD-10-CM | POA: Insufficient documentation

## 2012-10-03 DIAGNOSIS — O24419 Gestational diabetes mellitus in pregnancy, unspecified control: Secondary | ICD-10-CM | POA: Diagnosis present

## 2012-10-03 DIAGNOSIS — B951 Streptococcus, group B, as the cause of diseases classified elsewhere: Secondary | ICD-10-CM | POA: Diagnosis present

## 2012-10-03 DIAGNOSIS — O1213 Gestational proteinuria, third trimester: Secondary | ICD-10-CM

## 2012-10-03 DIAGNOSIS — K117 Disturbances of salivary secretion: Secondary | ICD-10-CM

## 2012-10-03 DIAGNOSIS — O322XX1 Maternal care for transverse and oblique lie, fetus 1: Secondary | ICD-10-CM

## 2012-10-03 DIAGNOSIS — Z98891 History of uterine scar from previous surgery: Secondary | ICD-10-CM | POA: Diagnosis not present

## 2012-10-03 DIAGNOSIS — O2340 Unspecified infection of urinary tract in pregnancy, unspecified trimester: Secondary | ICD-10-CM | POA: Diagnosis present

## 2012-10-03 DIAGNOSIS — O1210 Gestational proteinuria, unspecified trimester: Secondary | ICD-10-CM | POA: Diagnosis present

## 2012-10-03 DIAGNOSIS — R638 Other symptoms and signs concerning food and fluid intake: Secondary | ICD-10-CM | POA: Diagnosis present

## 2012-10-03 DIAGNOSIS — O09293 Supervision of pregnancy with other poor reproductive or obstetric history, third trimester: Secondary | ICD-10-CM

## 2012-10-03 DIAGNOSIS — O9902 Anemia complicating childbirth: Secondary | ICD-10-CM | POA: Diagnosis present

## 2012-10-03 LAB — LACTATE DEHYDROGENASE: LDH: 206 U/L (ref 94–250)

## 2012-10-03 LAB — COMPREHENSIVE METABOLIC PANEL
Alkaline Phosphatase: 136 U/L — ABNORMAL HIGH (ref 39–117)
BUN: 6 mg/dL (ref 6–23)
CO2: 21 mEq/L (ref 19–32)
Chloride: 103 mEq/L (ref 96–112)
GFR calc Af Amer: >90 mL/min (ref >90)
GFR calc non Af Amer: >90 mL/min (ref >90)
Glucose, Bld: 152 mg/dL — ABNORMAL HIGH (ref 70–99)
Potassium: 4.2 mEq/L (ref 3.5–5.1)
Total Bilirubin: 0.2 mg/dL — ABNORMAL LOW (ref 0.3–1.2)
Total Protein: 6.8 g/dL (ref 6.0–8.3)

## 2012-10-03 LAB — URINE MICROSCOPIC-ADD ON

## 2012-10-03 LAB — GLUCOSE, CAPILLARY: Glucose-Capillary: 190 mg/dL — ABNORMAL HIGH (ref 70–99)

## 2012-10-03 LAB — URINALYSIS, ROUTINE W REFLEX MICROSCOPIC
Bilirubin Urine: NEGATIVE
Glucose, UA: NEGATIVE mg/dL
Ketones, ur: NEGATIVE mg/dL
Protein, ur: NEGATIVE mg/dL

## 2012-10-03 LAB — CBC
HCT: 29 % — ABNORMAL LOW (ref 36.0–46.0)
Hemoglobin: 9.3 g/dL — ABNORMAL LOW (ref 12.0–15.0)
MCHC: 32.1 g/dL (ref 30.0–36.0)

## 2012-10-03 MED ORDER — NIFEDIPINE 10 MG PO CAPS
10.0000 mg | ORAL_CAPSULE | Freq: Once | ORAL | Status: AC
  Administered 2012-10-03: 10 mg via ORAL
  Filled 2012-10-03: qty 1

## 2012-10-03 MED ORDER — GLYBURIDE 2.5 MG PO TABS
2.5000 mg | ORAL_TABLET | Freq: Every day | ORAL | Status: DC
  Administered 2012-10-04: 2.5 mg via ORAL
  Filled 2012-10-03 (×3): qty 1

## 2012-10-03 MED ORDER — CALCIUM CARBONATE ANTACID 500 MG PO CHEW
1.0000 | CHEWABLE_TABLET | Freq: Every evening | ORAL | Status: DC | PRN
  Administered 2012-10-05: 200 mg via ORAL
  Filled 2012-10-03: qty 2

## 2012-10-03 MED ORDER — PRENATAL MULTIVITAMIN CH
1.0000 | ORAL_TABLET | Freq: Every day | ORAL | Status: DC
  Administered 2012-10-03 – 2012-10-04 (×2): 1 via ORAL
  Filled 2012-10-03 (×2): qty 1

## 2012-10-03 MED ORDER — PROMETHAZINE HCL 25 MG PO TABS
25.0000 mg | ORAL_TABLET | Freq: Four times a day (QID) | ORAL | Status: DC | PRN
  Administered 2012-10-03: 25 mg via ORAL
  Filled 2012-10-03: qty 1

## 2012-10-03 MED ORDER — ACETAMINOPHEN 325 MG PO TABS: 650.0000 mg | ORAL_TABLET | ORAL | Status: DC | PRN

## 2012-10-03 MED ORDER — DOCUSATE SODIUM 100 MG PO CAPS
100.0000 mg | ORAL_CAPSULE | Freq: Every day | ORAL | Status: DC
  Administered 2012-10-03 – 2012-10-05 (×3): 100 mg via ORAL
  Filled 2012-10-03 (×5): qty 1

## 2012-10-03 MED ORDER — METOCLOPRAMIDE HCL 10 MG PO TABS
10.0000 mg | ORAL_TABLET | Freq: Once | ORAL | Status: AC
  Administered 2012-10-03: 10 mg via ORAL
  Filled 2012-10-03: qty 1

## 2012-10-03 MED ORDER — ZOLPIDEM TARTRATE 5 MG PO TABS: 5.0000 mg | ORAL_TABLET | Freq: Every evening | ORAL | Status: DC | PRN

## 2012-10-03 MED ORDER — GLYBURIDE 5 MG PO TABS
5.0000 mg | ORAL_TABLET | Freq: Every day | ORAL | Status: DC
  Administered 2012-10-03: 5 mg via ORAL
  Filled 2012-10-03 (×2): qty 1

## 2012-10-03 NOTE — MAU Note (Signed)
"  I've had a H/A for 3-4 days and more severe the last 2 days.  I have BPPs done weekly and the last one Thursday said the baby was transverse.  (+) FM."

## 2012-10-03 NOTE — MAU Provider Note (Signed)
History     CSN: [id number]  Arrival date and time: 10/03/12 8469   First Provider Initiated Contact with Patient 10/03/12 1049      Chief Complaint  Patient presents with  . Headache  . Hypertension   HPI Comments: Pt is a G3P2 at [redacted]w[redacted]d w CC of headache and reports elevated BP w readings taken at home, have been 170's/100's, and repeat 140's/100's. She states HA has been for several days, had tried tylenol without any relief, reports hx of headaches during her pregnancies. Has not had neurology work-up. Headache is on R side only. Denies any blurry vision or floaters. Currently no nausea/vomiting, but had did have vomiting yesterday. Denies any RUQ pain. C/o ctx about 10 min apart have been that way "for a few days" Denies any LOF, or VB, reports GFM.   Pt is a GDM on glyburide, reports FBS today was 69.  Infant is macrosomic w EFW 11#  Infant is not vertex, ?transverse Pt has hx of proteinuria and has done 3 24 hour urine collections w normal results. Most recent on 2/4 =125mg .  Vag exam on Thursday was FT/30%     Headache  Associated symptoms include abdominal pain, nausea, photophobia and vomiting. Pertinent negatives include no back pain, blurred vision, dizziness, neck pain or tingling. Her past medical history is significant for hypertension.  Hypertension Associated symptoms include headaches. Pertinent negatives include no blurred vision, chest pain or neck pain.      Past Medical History  Diagnosis Date  . Shortness of breath     AGE 842-21; WITH STRESS  . Irritable bowel     X SEVERAL YEARS;  RESOLVED 2006  . Varicose veins   . Anginal pain     AGE 842-21; HAD EVAL; NO DX; OCCURS WITH STRSS ONT BLOOD THINNERS AGE 76  . Anemia     CHRONIC  . H/O varicella   . Low iron   . Obesity   . Gestational diabetes 2014    current pregnancy    Past Surgical History  Procedure Laterality Date  . Vulva surgery  at age 31    clitorectomy    Family History  Problem  Relation Age of Onset  . Hypertension Father   . Hyperlipidemia Paternal Aunt   . Diabetes Paternal Aunt   . Hypertension Paternal Aunt   . Kidney disease Paternal Aunt   . Hyperlipidemia Paternal Uncle   . Diabetes Paternal Uncle   . Hypertension Paternal Uncle   . Stroke Paternal Uncle   . Rheum arthritis Paternal Uncle   . Arthritis Maternal Grandmother   . Diabetes Maternal Grandmother   . Hyperlipidemia Maternal Grandmother   . Other Maternal Grandmother     VARICOSE VEINS  . Heart disease Maternal Grandfather   . Vision loss Paternal Grandfather     GLAUCOMA    History  Substance Use Topics  . Smoking status: Never Smoker   . Smokeless tobacco: Never Used  . Alcohol Use: No    Allergies:  Allergies  Allergen Reactions  . Prednisone     PRICKLY FEELING;     Prescriptions prior to admission  Medication Sig Dispense Refill  . alum & mag hydroxide-simeth (MAALOX/MYLANTA) 200-200-20 MG/5ML suspension Take 15 mLs by mouth as needed for indigestion.      . calcium carbonate (TUMS - DOSED IN MG ELEMENTAL CALCIUM) 500 MG chewable tablet Chew 1 tablet by mouth at bedtime as needed for heartburn.      Marland Kitchen  docusate sodium (COLACE) 100 MG capsule Take 100 mg by mouth daily as needed. OTC      . glyBURIDE (DIABETA) 2.5 MG tablet Take 2.5 mg by mouth daily with breakfast.      . glyBURIDE (DIABETA) 5 MG tablet Take 5 mg by mouth at bedtime.       Marland Kitchen nystatin-triamcinolone ointment (MYCOLOG) Apply topically 3 (three) times daily as needed.  60 g  0  . Prenatal Vit-Fe Fumarate-FA (PRENATAL MULTIVITAMIN) TABS Take 1 tablet by mouth daily.        Review of Systems  HENT: Negative for neck pain.   Eyes: Positive for photophobia. Negative for blurred vision and double vision.  Cardiovascular: Positive for leg swelling. Negative for chest pain.        Had more edema last week, has decreased the last few days   Gastrointestinal: Positive for heartburn, nausea, vomiting and abdominal  pain. Negative for constipation.       Hx reflux N/V yesterday, none today Ctx about every 10 min  Denies RUQ pain   Genitourinary: Negative for dysuria, urgency and frequency.  Musculoskeletal: Negative for myalgias and back pain.  Neurological: Positive for headaches. Negative for dizziness, tingling, sensory change and speech change.  Psychiatric/Behavioral: The patient is nervous/anxious.        Anxiety related to position of baby and possibly needing a c/s   All other systems reviewed and are negative.   Physical Exam   Blood pressure 147/96, pulse 100, temperature 98.4 F (36.9 C), temperature source Oral, resp. rate 18, last menstrual period 01/20/2012.  Physical Exam  Nursing note and vitals reviewed. Constitutional: She is oriented to person, place, and time. She appears well-developed and well-nourished. No distress.  HENT:  Head: Normocephalic.  Eyes: EOM are normal. Pupils are equal, round, and reactive to light.  Neck: Normal range of motion.  Cardiovascular: Normal rate, regular rhythm and normal heart sounds.   Respiratory: Effort normal and breath sounds normal.  GI: Soft. Bowel sounds are normal. There is no tenderness.  Genitourinary: Vaginal discharge found.  Increased mucous, scant bloody show  cx stretchy, 3cm/70/no presenting part   Musculoskeletal: Normal range of motion. She exhibits edema.  1+ LEE, non-pitting   Neurological: She is alert and oriented to person, place, and time. She has normal reflexes.  Skin: Skin is warm and dry.  Psychiatric: She has a normal mood and affect. Her behavior is normal.   FHR cat 1 toco irreg 6-10 min  Results for orders placed during the hospital encounter of 10/03/12 (from the past 24 hour(s))  URINALYSIS, ROUTINE W REFLEX MICROSCOPIC     Status: Abnormal   Collection Time    10/03/12  9:30 AM      Result Value Range   Color, Urine YELLOW  YELLOW   APPearance CLEAR  CLEAR   Specific Gravity, Urine 1.020  1.005  - 1.030   pH 6.5  5.0 - 8.0   Glucose, UA NEGATIVE  NEGATIVE mg/dL   Hgb urine dipstick TRACE (*) NEGATIVE   Bilirubin Urine NEGATIVE  NEGATIVE   Ketones, ur NEGATIVE  NEGATIVE mg/dL   Protein, ur NEGATIVE  NEGATIVE mg/dL   Urobilinogen, UA 0.2  0.0 - 1.0 mg/dL   Nitrite NEGATIVE  NEGATIVE   Leukocytes, UA SMALL (*) NEGATIVE  URINE MICROSCOPIC-ADD ON     Status: Abnormal   Collection Time    10/03/12  9:30 AM      Result Value Range  Squamous Epithelial / LPF FEW (*) RARE   WBC, UA 7-10  <3 WBC/hpf   RBC / HPF 0-2  <3 RBC/hpf   Bacteria, UA FEW (*) RARE   Urine-Other MUCOUS PRESENT    CBC     Status: Abnormal   Collection Time    10/03/12  9:55 AM      Result Value Range   WBC 5.9  4.0 - 10.5 K/uL   RBC 3.70 (*) 3.87 - 5.11 MIL/uL   Hemoglobin 9.3 (*) 12.0 - 15.0 g/dL   HCT 96.0 (*) 45.4 - 09.8 %   MCV 78.4  78.0 - 100.0 fL   MCH 25.1 (*) 26.0 - 34.0 pg   MCHC 32.1  30.0 - 36.0 g/dL   RDW 11.9  14.7 - 82.9 %   Platelets 233  150 - 400 K/uL  COMPREHENSIVE METABOLIC PANEL     Status: Abnormal   Collection Time    10/03/12  9:55 AM      Result Value Range   Sodium 134 (*) 135 - 145 mEq/L   Potassium 4.2  3.5 - 5.1 mEq/L   Chloride 103  96 - 112 mEq/L   CO2 21  19 - 32 mEq/L   Glucose, Bld 152 (*) 70 - 99 mg/dL   BUN 6  6 - 23 mg/dL   Creatinine, Ser 5.62 (*) 0.50 - 1.10 mg/dL   Calcium 9.0  8.4 - 13.0 mg/dL   Total Protein 6.8  6.0 - 8.3 g/dL   Albumin 2.3 (*) 3.5 - 5.2 g/dL   AST 16  0 - 37 U/L   ALT 8  0 - 35 U/L   Alkaline Phosphatase 136 (*) 39 - 117 U/L   Total Bilirubin 0.2 (*) 0.3 - 1.2 mg/dL   GFR calc non Af Amer >90  >90 mL/min   GFR calc Af Amer >90  >90 mL/min  LACTATE DEHYDROGENASE     Status: None   Collection Time    10/03/12  9:55 AM      Result Value Range   LDH 206  94 - 250 U/L  URIC ACID     Status: None   Collection Time    10/03/12  9:55 AM      Result Value Range   Uric Acid, Serum 5.4  2.4 - 7.0 mg/dL     MAU Course   Procedures    Assessment and Plan  IUP at [redacted]w[redacted]d Gestational hypertension vs. pre-eclampsia  GDM on glyburide w good control  Macrosomia  Anemia GBS pos UA cx from 1/24, RV swab pending from 2/27  ?early labor   PIH labs WNL Neg proteinuria  +leukocytes, will send for UA for cx  BP's elevated 140's-150's/90's  Fetal presentation transverse   Will give regan and procardia  Recheck cervix at 1245 If not laboring will admit to ante for observation and begin 24hr urine collection If cervix changing and laboring will recommend c/s  D/w Dr Imagene Gurney 10/03/2012, 11:35 AM

## 2012-10-03 NOTE — Telephone Encounter (Signed)
TC from patient--36 weeks, gestational diabetic on glyburide. HA and some contractions during night.  Has had HAs during pregnancy. Took BP--170/110.  No HTN during pregnancy. LGA fetus, with last EFW 11 lbs, and breech on [place] last week.  Come to MAU.

## 2012-10-03 NOTE — H&P (Signed)
Jenny Marshall is a 37 y.o. female presenting for headache, in MAU, BP's noted to be elevated, infant is transverse.  Pt is GDM on glyburide, she reports hx of "migraines" states she frequently gets headaches in pregnancy and especially when she is in labor and having ctx, has not had hypertension this pregnancy.     Maternal Medical History:  Reason for admission: Observation for elevated BP's   Contractions: Onset was 2 days ago.   Frequency: irregular.   Perceived severity is mild.    Fetal activity: Perceived fetal activity is normal.   Last perceived fetal movement was within the past hour.    Prenatal complications: no prenatal complications Prenatal Complications - Diabetes: gestational. Diabetes is managed by oral agent (monotherapy).      OB History   Grav Para Term Preterm Abortions TAB SAB Ect Mult Living   3 2 2       2      Past Medical History  Diagnosis Date  . Shortness of breath     AGE 68-21; WITH STRESS  . Irritable bowel     X SEVERAL YEARS;  RESOLVED 2006  . Varicose veins   . Anginal pain     AGE 68-21; HAD EVAL; NO DX; OCCURS WITH STRSS ONT BLOOD THINNERS AGE 9  . Anemia     CHRONIC  . H/O varicella   . Low iron   . Obesity   . Gestational diabetes 2014    current pregnancy   Past Surgical History  Procedure Laterality Date  . Vulva surgery  at age 11    clitorectomy   Family History: family history includes Arthritis in her maternal grandmother; Diabetes in her maternal grandmother, paternal aunt, and paternal uncle; Heart disease in her maternal grandfather; Hyperlipidemia in her maternal grandmother, paternal aunt, and paternal uncle; Hypertension in her father, paternal aunt, and paternal uncle; Kidney disease in her paternal aunt; Other in her maternal grandmother; Rheum arthritis in her paternal uncle; Stroke in her paternal uncle; and Vision loss in her paternal grandfather. Social History:  reports that she has never smoked. She has never  used smokeless tobacco. She reports that she does not drink alcohol or use illicit drugs.   Prenatal Transfer Tool  Maternal Diabetes: Yes:  Diabetes Type:  Insulin/Medication controlled Genetic Screening: Declined Maternal Ultrasounds/Referrals: Abnormal:  Findings:   Other: macrosomia, EFW 5120g at 36wks  Fetal Ultrasounds or other Referrals:  None Maternal Substance Abuse:  No Significant Maternal Medications:  Meds include: Other:  glyburide,  Significant Maternal Lab Results:  Lab values include: Group B Strep positive Other Comments:  None  Review of Systems  Neurological: Positive for headaches.  Psychiatric/Behavioral: The patient is nervous/anxious.   All other systems reviewed and are negative.    Dilation: 3 Effacement (%): 70;80 Station: -3 Exam by:: Sanda Klein, CNM Blood pressure 124/80, pulse 110, temperature 98.4 F (36.9 C), temperature source Oral, resp. rate 18, height 5\' 9"  (1.753 m), weight 269 lb (122.018 kg), last menstrual period 01/20/2012. Maternal Exam:  Uterine Assessment: Contraction strength is mild.  Contraction duration is 60 seconds. Contraction frequency is irregular.   Abdomen: Patient reports no abdominal tenderness. Fundal height is S>D.   Estimated fetal weight is 11#.   Fetal presentation: no presenting part  Introitus: Normal vulva. Normal vagina.  Ferning test: not done.   Pelvis: adequate for delivery.   Cervix: Cervix evaluated by digital exam.     Fetal Exam Fetal Monitor Review: Mode: ultrasound.  Baseline rate: 130.  Variability: moderate (6-25 bpm).   Pattern: accelerations present and no decelerations.    Fetal State Assessment: Category I - tracings are normal.     Physical Exam  Nursing note and vitals reviewed. Constitutional: She is oriented to person, place, and time. She appears well-developed and well-nourished.  HENT:  Head: Normocephalic.  Eyes: Pupils are equal, round, and reactive to light.  Neck:  Normal range of motion.  Cardiovascular: Normal rate, regular rhythm and normal heart sounds.   Respiratory: Effort normal and breath sounds normal.  GI: Soft. Bowel sounds are normal.  Genitourinary: Vagina normal.  Musculoskeletal: Normal range of motion.  Neurological: She is alert and oriented to person, place, and time. She has normal reflexes.  Skin: Skin is warm and dry.  Psychiatric: She has a normal mood and affect. Her behavior is normal.    Prenatal labs: ABO, Rh: --/--/O POS (03/01 9562) Antibody: NEG (03/01 0955) Rubella: 30.6 (08/07 1433) RPR: NON REACTIVE (03/01 0955)  HBsAg: NEGATIVE (08/07 1433)  HIV: NON REACTIVE (08/07 1433)  GBS: Positive (01/24 0000)   Assessment/Plan: IUP at [redacted]w[redacted]d Gestational hypertension vs. pre-eclampsia Fetal macrosomia Fetal mal-presentation GDM  Admit to antepartum for 23hr observation per c/w Dr Su Hilt  24hr urine collection Monitor BP's Monitor CBG's    Espiridion Supinski M 10/03/2012, 9:28 PM

## 2012-10-03 NOTE — MAU Note (Signed)
Pt reports having headache and increased b/p at home 149/100. Sent by midwife.

## 2012-10-03 NOTE — Progress Notes (Signed)
Patient requested to be taken off the monitors to go to the bathroom, to eat her meal, and to sit with her family on the couch.  FHR tracing reassuring and reactive and blood pressure was good so patient was taken off the monitor.  Monitors reapplied and assessing after one hour.

## 2012-10-04 ENCOUNTER — Encounter: Payer: Self-pay | Admitting: Obstetrics and Gynecology

## 2012-10-04 DIAGNOSIS — O1490 Unspecified pre-eclampsia, unspecified trimester: Secondary | ICD-10-CM | POA: Diagnosis present

## 2012-10-04 DIAGNOSIS — D649 Anemia, unspecified: Secondary | ICD-10-CM | POA: Insufficient documentation

## 2012-10-04 LAB — CREATININE CLEARANCE, URINE, 24 HOUR
Collection Interval-CRCL: 24 hours
Creatinine, 24H Ur: 1324 mg/d (ref 700–1800)
Creatinine: 0.49 mg/dL — ABNORMAL LOW (ref 0.50–1.10)
Urine Total Volume-CRCL: 3250 mL

## 2012-10-04 LAB — URINE CULTURE

## 2012-10-04 LAB — PROTEIN, URINE, 24 HOUR: Urine Total Volume-UPROT: 3250 mL

## 2012-10-04 MED ORDER — GLYBURIDE 2.5 MG PO TABS
2.5000 mg | ORAL_TABLET | Freq: Every day | ORAL | Status: DC
  Administered 2012-10-04: 2.5 mg via ORAL
  Filled 2012-10-04 (×2): qty 1

## 2012-10-04 MED ORDER — FERROUS SULFATE 325 (65 FE) MG PO TABS
325.0000 mg | ORAL_TABLET | Freq: Two times a day (BID) | ORAL | Status: DC
  Administered 2012-10-04: 325 mg via ORAL
  Filled 2012-10-04: qty 1

## 2012-10-04 NOTE — Progress Notes (Signed)
Monitors off, family in room, pt sitting up on side of bed.

## 2012-10-04 NOTE — Progress Notes (Addendum)
Patient ID: Jenny Marshall, female   DOB: Jul 25, 1976, 37 y.o.   MRN: [id number] Jenny Marshall is a 37 y.o. G3P2002 at [redacted]w[redacted]d by ultrasound admitted for elevated BP, headache, preterm contractions.  Headache resolved with Phenergan.    Subjective: GI: deniesnausea, abdominal pain GU: Denies: dysuria, frequency/urgency, vaginal bleeding, pelvic pain OB: good fetal movement.  No complaint of contractions        Objective: BP 139/93  Pulse 91  Temp(Src) 98.1 F (36.7 C) (Oral)  Resp 20  Ht 5\' 9"  (1.753 m)  Wt 267 lb (121.11 kg)  BMI 39.41 kg/m2  LMP 01/20/2012 I/O last 3 completed shifts: In: 1440 [P.O.:1440] Out: 3850 [Urine:3850] Total I/O In: 1080 [P.O.:1080] Out: 100 [Urine:100] Results for orders placed during the hospital encounter of 10/03/12 (from the past 24 hour(s))  GLUCOSE, CAPILLARY     Status: Abnormal   Collection Time    10/03/12  4:53 PM      Result Value Range   Glucose-Capillary 190 (*) 70 - 99 mg/dL   Comment 1 Documented in Chart    PREPARE RBC (CROSSMATCH)     Status: None   Collection Time    10/03/12  9:00 PM      Result Value Range   Order Confirmation ORDER PROCESSED BY BLOOD BANK    GLUCOSE, CAPILLARY     Status: Abnormal   Collection Time    10/03/12 11:50 PM      Result Value Range   Glucose-Capillary 130 (*) 70 - 99 mg/dL  GLUCOSE, CAPILLARY     Status: Abnormal   Collection Time    10/04/12  6:07 AM      Result Value Range   Glucose-Capillary 46 (*) 70 - 99 mg/dL  GLUCOSE, CAPILLARY     Status: Abnormal   Collection Time    10/04/12 10:46 AM      Result Value Range   Glucose-Capillary 100 (*) 70 - 99 mg/dL   Comment 1 Documented in Chart       FHT:  FHR: 130-140 bpm, variability: moderate,  accelerations:  Present,  decelerations:  Absent UC:   2-4 per hour and mild SVE:   Dilation: 3 Effacement (%): 70;80 Station: -3 Exam by:: Sanda Klein, CNM Fetus remains transverse Labs: Lab Results  Component Value Date   WBC 5.9  10/03/2012   HGB 9.3* 10/03/2012   HCT 29.0* 10/03/2012   MCV 78.4 10/03/2012   PLT 233 10/03/2012    Assessment  IUP at 36-37 weeks GDM with hypoglycemia on glyburide this am.  Consider dietary noncompliance at home. Macrosomia on ultrasound at 36 wks Fetal malpresentation Proteinuria with hypertension, r/o pre-eclampsia Anemia   Plan Iron  Therapy Complete 24 hr urine collection at 1545 today.  Send for protein analysis Decrease evening glyburide to improve am hypoglycemia Discussed the current recommendation for cesarean section should delivery be required with infant in transverse lie.  Pt stated she doesn't want c-section unless there is no other choice.  Also discussed the recommendation for primary c-section because of large EFW, especially in light of perineal trauma sustained in previous pregnancy.  She declines for now.   Fetal Wellbeing:  Category I   HAYGOOD,VANESSA P 10/04/2012, 2:28 PM  24hr urine with 390mg  protein.  With elevated BPs and proteinuria now meeting criteria for preeclampsia, I have discussed delivery at 37wks.  Fetal position upon admission was transverse lie.  I recommend c-section tomorrow at 37wks.  Pt is hesitant because she really wanted a  vaginal delivery.  I discussed that she could not be delivered vaginally with this fetal position and she would like to be rescanned tomorrow prior to c-section.  She is considering requesting to wait an additional day or two to see if fetal position will change to vertex but I told her I would not recommend this.  I answered her questions and also discussed ECV is not recommended secondary to fetal size (5100g last u/s 10/01/12) and pt understands.  Her husband and the covering nurse were present for the discussion and several questions answered.

## 2012-10-04 NOTE — Progress Notes (Signed)
Pt removed monitors so she could just take a little break. H. Steelman notified. States that it is ok.

## 2012-10-05 ENCOUNTER — Inpatient Hospital Stay (HOSPITAL_COMMUNITY): Payer: Medicaid Other

## 2012-10-05 ENCOUNTER — Encounter (HOSPITAL_COMMUNITY)
Admission: AD | Disposition: A | Discharge status: Home / Self Care | Payer: Self-pay | Source: Ambulatory Visit | Attending: Obstetrics and Gynecology

## 2012-10-05 ENCOUNTER — Inpatient Hospital Stay (HOSPITAL_COMMUNITY): Payer: Medicaid Other | Admitting: Anesthesiology

## 2012-10-05 ENCOUNTER — Other Ambulatory Visit: Payer: Self-pay

## 2012-10-05 ENCOUNTER — Other Ambulatory Visit: Payer: Medicaid Other

## 2012-10-05 ENCOUNTER — Encounter: Payer: Medicaid Other | Admitting: Obstetrics and Gynecology

## 2012-10-05 ENCOUNTER — Encounter (HOSPITAL_COMMUNITY): Payer: Self-pay | Admitting: Anesthesiology

## 2012-10-05 DIAGNOSIS — Z98891 History of uterine scar from previous surgery: Secondary | ICD-10-CM | POA: Diagnosis not present

## 2012-10-05 DIAGNOSIS — O26849 Uterine size-date discrepancy, unspecified trimester: Secondary | ICD-10-CM

## 2012-10-05 DIAGNOSIS — O99810 Abnormal glucose complicating pregnancy: Secondary | ICD-10-CM

## 2012-10-05 LAB — COMPREHENSIVE METABOLIC PANEL
BUN: 8 mg/dL (ref 6–23)
Calcium: 9.5 mg/dL (ref 8.4–10.5)
GFR calc Af Amer: >90 mL/min (ref >90)
GFR calc non Af Amer: >90 mL/min (ref >90)
Glucose, Bld: 82 mg/dL (ref 70–99)
Total Protein: 6.1 g/dL (ref 6.0–8.3)

## 2012-10-05 LAB — STREP B DNA PROBE: GBSP: POSITIVE

## 2012-10-05 LAB — LACTATE DEHYDROGENASE: LDH: 178 U/L (ref 94–250)

## 2012-10-05 LAB — GLUCOSE, CAPILLARY
Glucose-Capillary: 73 mg/dL (ref 70–99)
Glucose-Capillary: 65 mg/dL — ABNORMAL LOW (ref 70–99)
Glucose-Capillary: 84 mg/dL (ref 70–99)

## 2012-10-05 LAB — CBC
HCT: 28.2 % — ABNORMAL LOW (ref 36.0–46.0)
Hemoglobin: 8.9 g/dL — ABNORMAL LOW (ref 12.0–15.0)
MCH: 24.7 pg — ABNORMAL LOW (ref 26.0–34.0)
MCHC: 31.6 g/dL (ref 30.0–36.0)
MCV: 78.1 fL (ref 78.0–100.0)

## 2012-10-05 SURGERY — Surgical Case
Anesthesia: Spinal | Site: Abdomen | Wound class: Clean Contaminated

## 2012-10-05 MED ORDER — LANOLIN HYDROUS EX OINT: 1.0000 "application " | TOPICAL_OINTMENT | CUTANEOUS | Status: DC | PRN

## 2012-10-05 MED ORDER — MEASLES, MUMPS & RUBELLA VAC ~~LOC~~ INJ: 0.5000 mL | INJECTION | Freq: Once | SUBCUTANEOUS | Status: DC

## 2012-10-05 MED ORDER — DIBUCAINE 1 % RE OINT: 1.0000 "application " | TOPICAL_OINTMENT | RECTAL | Status: DC | PRN

## 2012-10-05 MED ORDER — NALOXONE HCL 1 MG/ML IJ SOLN: 1.0000 ug/kg/h | INTRAVENOUS | Status: DC | PRN

## 2012-10-05 MED ORDER — PHENYLEPHRINE HCL 10 MG/ML IJ SOLN
INTRAMUSCULAR | Status: DC | PRN
  Administered 2012-10-05: 80 ug via INTRAVENOUS
  Administered 2012-10-05: 40 ug via INTRAVENOUS
  Administered 2012-10-05 (×3): 80 ug via INTRAVENOUS
  Administered 2012-10-05: 40 ug via INTRAVENOUS
  Administered 2012-10-05 (×3): 80 ug via INTRAVENOUS
  Administered 2012-10-05: 40 ug via INTRAVENOUS
  Administered 2012-10-05 (×2): 80 ug via INTRAVENOUS

## 2012-10-05 MED ORDER — TETANUS-DIPHTH-ACELL PERTUSSIS 5-2.5-18.5 LF-MCG/0.5 IM SUSP
0.5000 mL | Freq: Once | INTRAMUSCULAR | Status: DC
  Filled 2012-10-05: qty 0.5

## 2012-10-05 MED ORDER — DIPHENHYDRAMINE HCL 50 MG/ML IJ SOLN: 25.0000 mg | INTRAMUSCULAR | Status: DC | PRN

## 2012-10-05 MED ORDER — METHYLERGONOVINE MALEATE 0.2 MG PO TABS: 0.2000 mg | ORAL_TABLET | ORAL | Status: DC | PRN

## 2012-10-05 MED ORDER — PRENATAL MULTIVITAMIN CH
1.0000 | ORAL_TABLET | Freq: Every day | ORAL | Status: DC
  Administered 2012-10-06 – 2012-10-09 (×4): 1 via ORAL
  Filled 2012-10-05 (×4): qty 1

## 2012-10-05 MED ORDER — FENTANYL CITRATE 0.05 MG/ML IJ SOLN
INTRAMUSCULAR | Status: AC
  Filled 2012-10-05: qty 2

## 2012-10-05 MED ORDER — LACTATED RINGERS IV SOLN
INTRAVENOUS | Status: DC
  Administered 2012-10-05 – 2012-10-06 (×2): via INTRAVENOUS

## 2012-10-05 MED ORDER — SIMETHICONE 80 MG PO CHEW
80.0000 mg | CHEWABLE_TABLET | ORAL | Status: DC | PRN
  Administered 2012-10-07 – 2012-10-09 (×2): 80 mg via ORAL

## 2012-10-05 MED ORDER — DIPHENHYDRAMINE HCL 25 MG PO CAPS
25.0000 mg | ORAL_CAPSULE | ORAL | Status: DC | PRN
  Filled 2012-10-05: qty 1

## 2012-10-05 MED ORDER — KETOROLAC TROMETHAMINE 30 MG/ML IJ SOLN
INTRAMUSCULAR | Status: AC
  Administered 2012-10-05: 30 mg
  Filled 2012-10-05: qty 1

## 2012-10-05 MED ORDER — DIPHENHYDRAMINE HCL 25 MG PO CAPS: 25.0000 mg | ORAL_CAPSULE | Freq: Four times a day (QID) | ORAL | Status: DC | PRN

## 2012-10-05 MED ORDER — ONDANSETRON HCL 4 MG/2ML IJ SOLN
INTRAMUSCULAR | Status: DC | PRN
  Administered 2012-10-05: 4 mg via INTRAVENOUS

## 2012-10-05 MED ORDER — MENTHOL 3 MG MT LOZG: 1.0000 | LOZENGE | OROMUCOSAL | Status: DC | PRN

## 2012-10-05 MED ORDER — SCOPOLAMINE 1 MG/3DAYS TD PT72
MEDICATED_PATCH | TRANSDERMAL | Status: AC
  Filled 2012-10-05: qty 1

## 2012-10-05 MED ORDER — ZOLPIDEM TARTRATE 5 MG PO TABS: 5.0000 mg | ORAL_TABLET | Freq: Every evening | ORAL | Status: DC | PRN

## 2012-10-05 MED ORDER — SCOPOLAMINE 1 MG/3DAYS TD PT72
1.0000 | MEDICATED_PATCH | Freq: Once | TRANSDERMAL | Status: DC
  Administered 2012-10-05: 1.5 mg via TRANSDERMAL

## 2012-10-05 MED ORDER — KETOROLAC TROMETHAMINE 60 MG/2ML IM SOLN: 60.0000 mg | Freq: Once | INTRAMUSCULAR | Status: AC | PRN

## 2012-10-05 MED ORDER — DIPHENHYDRAMINE HCL 50 MG/ML IJ SOLN: 12.5000 mg | INTRAMUSCULAR | Status: DC | PRN

## 2012-10-05 MED ORDER — KETOROLAC TROMETHAMINE 30 MG/ML IJ SOLN: 30.0000 mg | Freq: Four times a day (QID) | INTRAMUSCULAR | Status: AC | PRN

## 2012-10-05 MED ORDER — FENTANYL CITRATE 0.05 MG/ML IJ SOLN
INTRAMUSCULAR | Status: DC | PRN
  Administered 2012-10-05: 12.5 ug via INTRATHECAL

## 2012-10-05 MED ORDER — SODIUM CHLORIDE 0.9 % IJ SOLN: 3.0000 mL | INTRAMUSCULAR | Status: DC | PRN

## 2012-10-05 MED ORDER — MEPERIDINE HCL 25 MG/ML IJ SOLN: 6.2500 mg | INTRAMUSCULAR | Status: DC | PRN

## 2012-10-05 MED ORDER — MORPHINE SULFATE (PF) 0.5 MG/ML IJ SOLN
INTRAMUSCULAR | Status: DC | PRN
  Administered 2012-10-05: .2 mg via INTRATHECAL

## 2012-10-05 MED ORDER — MORPHINE SULFATE 0.5 MG/ML IJ SOLN
INTRAMUSCULAR | Status: AC
  Filled 2012-10-05: qty 10

## 2012-10-05 MED ORDER — HYDROMORPHONE HCL PF 1 MG/ML IJ SOLN: 0.2500 mg | INTRAMUSCULAR | Status: DC | PRN

## 2012-10-05 MED ORDER — ONDANSETRON HCL 4 MG/2ML IJ SOLN
4.0000 mg | INTRAMUSCULAR | Status: DC | PRN
  Administered 2012-10-05: 4 mg via INTRAVENOUS
  Filled 2012-10-05: qty 2

## 2012-10-05 MED ORDER — BUPIVACAINE IN DEXTROSE 0.75-8.25 % IT SOLN
INTRATHECAL | Status: DC | PRN
  Administered 2012-10-05: 1.5 mL via INTRATHECAL

## 2012-10-05 MED ORDER — ONDANSETRON HCL 4 MG PO TABS: 4.0000 mg | ORAL_TABLET | ORAL | Status: DC | PRN

## 2012-10-05 MED ORDER — ONDANSETRON HCL 4 MG/2ML IJ SOLN: 4.0000 mg | Freq: Three times a day (TID) | INTRAMUSCULAR | Status: DC | PRN

## 2012-10-05 MED ORDER — NALBUPHINE HCL 10 MG/ML IJ SOLN: 5.0000 mg | INTRAMUSCULAR | Status: DC | PRN

## 2012-10-05 MED ORDER — NALOXONE HCL 0.4 MG/ML IJ SOLN: 0.4000 mg | INTRAMUSCULAR | Status: DC | PRN

## 2012-10-05 MED ORDER — MAGNESIUM SULFATE 40 G IN LACTATED RINGERS - SIMPLE
2.0000 g/h | INTRAVENOUS | Status: AC
  Administered 2012-10-05 – 2012-10-06 (×2): 2 g/h via INTRAVENOUS
  Filled 2012-10-05 (×2): qty 500

## 2012-10-05 MED ORDER — MAGNESIUM SULFATE BOLUS VIA INFUSION
2.0000 g | Freq: Once | INTRAVENOUS | Status: AC
  Administered 2012-10-05: 2 g via INTRAVENOUS
  Filled 2012-10-05: qty 500

## 2012-10-05 MED ORDER — SIMETHICONE 80 MG PO CHEW
80.0000 mg | CHEWABLE_TABLET | Freq: Three times a day (TID) | ORAL | Status: DC
  Administered 2012-10-05 – 2012-10-09 (×12): 80 mg via ORAL

## 2012-10-05 MED ORDER — PHENYLEPHRINE 40 MCG/ML (10ML) SYRINGE FOR IV PUSH (FOR BLOOD PRESSURE SUPPORT)
PREFILLED_SYRINGE | INTRAVENOUS | Status: AC
  Filled 2012-10-05: qty 5

## 2012-10-05 MED ORDER — LACTATED RINGERS IV SOLN
INTRAVENOUS | Status: DC | PRN
  Administered 2012-10-05 (×2): via INTRAVENOUS

## 2012-10-05 MED ORDER — CITRIC ACID-SODIUM CITRATE 334-500 MG/5ML PO SOLN
ORAL | Status: AC
  Administered 2012-10-05: 30 mL
  Filled 2012-10-05: qty 15

## 2012-10-05 MED ORDER — DEXTROSE 5 % IV SOLN
3.0000 g | INTRAVENOUS | Status: DC | PRN
  Administered 2012-10-05: 3 g via INTRAVENOUS

## 2012-10-05 MED ORDER — WITCH HAZEL-GLYCERIN EX PADS: 1.0000 "application " | MEDICATED_PAD | CUTANEOUS | Status: DC | PRN

## 2012-10-05 MED ORDER — PHENYLEPHRINE 40 MCG/ML (10ML) SYRINGE FOR IV PUSH (FOR BLOOD PRESSURE SUPPORT)
PREFILLED_SYRINGE | INTRAVENOUS | Status: AC
  Filled 2012-10-05: qty 10

## 2012-10-05 MED ORDER — METOCLOPRAMIDE HCL 5 MG/ML IJ SOLN: 10.0000 mg | Freq: Three times a day (TID) | INTRAMUSCULAR | Status: DC | PRN

## 2012-10-05 MED ORDER — BUPIVACAINE HCL (PF) 0.25 % IJ SOLN
INTRAMUSCULAR | Status: DC | PRN
  Administered 2012-10-05: 20 mL

## 2012-10-05 MED ORDER — OXYTOCIN 40 UNITS IN LACTATED RINGERS INFUSION - SIMPLE MED: 62.5000 mL/h | INTRAVENOUS | Status: AC

## 2012-10-05 MED ORDER — KETOROLAC TROMETHAMINE 30 MG/ML IJ SOLN: 15.0000 mg | Freq: Once | INTRAMUSCULAR | Status: DC | PRN

## 2012-10-05 MED ORDER — ONDANSETRON HCL 4 MG/2ML IJ SOLN
INTRAMUSCULAR | Status: AC
  Filled 2012-10-05: qty 2

## 2012-10-05 MED ORDER — MAGNESIUM SULFATE 40 MG/ML IJ SOLN: 2.0000 g | Freq: Once | INTRAMUSCULAR | Status: DC

## 2012-10-05 MED ORDER — SENNOSIDES-DOCUSATE SODIUM 8.6-50 MG PO TABS
2.0000 | ORAL_TABLET | Freq: Every day | ORAL | Status: DC
  Administered 2012-10-05 – 2012-10-09 (×5): 2 via ORAL

## 2012-10-05 MED ORDER — MAGNESIUM SULFATE 40 G IN LACTATED RINGERS - SIMPLE: 2.0000 g/h | INTRAVENOUS | Status: DC

## 2012-10-05 MED ORDER — PROMETHAZINE HCL 25 MG/ML IJ SOLN: 6.2500 mg | INTRAMUSCULAR | Status: DC | PRN

## 2012-10-05 MED ORDER — METHYLERGONOVINE MALEATE 0.2 MG/ML IJ SOLN: 0.2000 mg | INTRAMUSCULAR | Status: DC | PRN

## 2012-10-05 MED ORDER — OXYCODONE-ACETAMINOPHEN 5-325 MG PO TABS
1.0000 | ORAL_TABLET | ORAL | Status: DC | PRN
  Administered 2012-10-08 – 2012-10-09 (×2): 1 via ORAL
  Administered 2012-10-09: 2 via ORAL
  Administered 2012-10-09: 1 via ORAL
  Filled 2012-10-05: qty 1
  Filled 2012-10-05: qty 2
  Filled 2012-10-05 (×2): qty 1

## 2012-10-05 MED ORDER — SODIUM CHLORIDE 0.9 % IR SOLN
Status: DC | PRN
  Administered 2012-10-05: 1000 mL

## 2012-10-05 MED ORDER — FERROUS SULFATE 325 (65 FE) MG PO TABS
325.0000 mg | ORAL_TABLET | Freq: Two times a day (BID) | ORAL | Status: DC
  Administered 2012-10-06 – 2012-10-08 (×5): 325 mg via ORAL
  Filled 2012-10-05 (×5): qty 1

## 2012-10-05 MED ORDER — IBUPROFEN 600 MG PO TABS
600.0000 mg | ORAL_TABLET | Freq: Four times a day (QID) | ORAL | Status: DC
  Administered 2012-10-06 – 2012-10-09 (×17): 600 mg via ORAL
  Filled 2012-10-05 (×17): qty 1

## 2012-10-05 MED ORDER — OXYTOCIN 10 UNIT/ML IJ SOLN
INTRAMUSCULAR | Status: AC
  Filled 2012-10-05: qty 4

## 2012-10-05 MED ORDER — DEXTROSE 5 % IV SOLN
3.0000 g | INTRAVENOUS | Status: DC
  Filled 2012-10-05: qty 3000

## 2012-10-05 SURGICAL SUPPLY — 39 items
APL SKNCLS STERI-STRIP NONHPOA (GAUZE/BANDAGES/DRESSINGS) ×1
BENZOIN TINCTURE PRP APPL 2/3 (GAUZE/BANDAGES/DRESSINGS) ×2 IMPLANT
BOOTIES KNEE HIGH SLOAN (MISCELLANEOUS) ×4 IMPLANT
CLOTH BEACON ORANGE TIMEOUT ST (SAFETY) ×2 IMPLANT
DRAIN JACKSON PRT FLT 10 (DRAIN) IMPLANT
DRAPE LG THREE QUARTER DISP (DRAPES) ×2 IMPLANT
DRESSING TELFA 8X3 (GAUZE/BANDAGES/DRESSINGS) ×1 IMPLANT
DRSG OPSITE POSTOP 4X10 (GAUZE/BANDAGES/DRESSINGS) ×2 IMPLANT
DURAPREP 26ML APPLICATOR (WOUND CARE) ×2 IMPLANT
ELECT REM PT RETURN 9FT ADLT (ELECTROSURGICAL) ×2
ELECTRODE REM PT RTRN 9FT ADLT (ELECTROSURGICAL) ×1 IMPLANT
EVACUATOR SILICONE 100CC (DRAIN) IMPLANT
EXTRACTOR VACUUM M CUP 4 TUBE (SUCTIONS) IMPLANT
GAUZE SPONGE 4X4 12PLY STRL LF (GAUZE/BANDAGES/DRESSINGS) ×2 IMPLANT
GLOVE BIOGEL PI IND STRL 7.0 (GLOVE) ×1 IMPLANT
GLOVE BIOGEL PI INDICATOR 7.0 (GLOVE) ×1
GLOVE ECLIPSE 6.5 STRL STRAW (GLOVE) ×2 IMPLANT
GOWN STRL REIN XL XLG (GOWN DISPOSABLE) ×4 IMPLANT
KIT ABG SYR 3ML LUER SLIP (SYRINGE) IMPLANT
NDL HYPO 25X5/8 SAFETYGLIDE (NEEDLE) IMPLANT
NEEDLE HYPO 22GX1.5 SAFETY (NEEDLE) ×2 IMPLANT
NEEDLE HYPO 25X5/8 SAFETYGLIDE (NEEDLE) IMPLANT
NS IRRIG 1000ML POUR BTL (IV SOLUTION) ×4 IMPLANT
PACK C SECTION WH (CUSTOM PROCEDURE TRAY) ×2 IMPLANT
PAD ABD 7.5X8 STRL (GAUZE/BANDAGES/DRESSINGS) ×1 IMPLANT
PAD OB MATERNITY 4.3X12.25 (PERSONAL CARE ITEMS) ×2 IMPLANT
RTRCTR C-SECT PINK 25CM LRG (MISCELLANEOUS) ×2 IMPLANT
SLEEVE SCD COMPRESS KNEE MED (MISCELLANEOUS) IMPLANT
STRIP CLOSURE SKIN 1/2X4 (GAUZE/BANDAGES/DRESSINGS) ×2 IMPLANT
SUT CHROMIC GUT AB #0 18 (SUTURE) IMPLANT
SUT MNCRL AB 3-0 PS2 27 (SUTURE) ×2 IMPLANT
SUT SILK 2 0 FSL 18 (SUTURE) IMPLANT
SUT VIC AB 0 CTX 36 (SUTURE) ×4
SUT VIC AB 0 CTX36XBRD ANBCTRL (SUTURE) ×2 IMPLANT
SUT VIC AB 1 CT1 36 (SUTURE) ×4 IMPLANT
SYR 20CC LL (SYRINGE) ×2 IMPLANT
TOWEL OR 17X24 6PK STRL BLUE (TOWEL DISPOSABLE) ×6 IMPLANT
TRAY FOLEY CATH 14FR (SET/KITS/TRAYS/PACK) ×2 IMPLANT
WATER STERILE IRR 1000ML POUR (IV SOLUTION) ×2 IMPLANT

## 2012-10-05 NOTE — Progress Notes (Signed)
Hospital day # 2 pregnancy at [redacted]w[redacted]d--Admitted with elevated BP, transverse lie, LGA fetus (11+ lbs at 36 weeks)  S:  Doing well, reports good fetal activity      Perception of contractions: Occasional, mild.      Vaginal bleeding: None       Vaginal discharge:  None      Denies HA, visual sx, epigastric pain.       Currently has been NPO--Dr. Arby Barrette has OK'd clear liquids until 11am  O: BP 151/101  Pulse 120  Temp(Src) 97.7 F (36.5 C) (Oral)  Resp 16  Ht 5\' 9"  (1.753 m)  Wt 267 lb (121.11 kg)  BMI 39.41 kg/m2  LMP 01/20/2012  Filed Vitals:   10/04/12 2300 10/05/12 0003 10/05/12 0009 10/05/12 0901  BP:  154/87 132/99 151/101  Pulse:  114 104 120  Temp:  98.1 F (36.7 C)  97.7 F (36.5 C)  TempSrc:  Oral  Oral  Resp: 18 18  16   Height:      Weight:      Last BP taken with patient sitting on side of bed       Fetal tracings:  Category 1      Contractions:   Occasional      Uterus non-tender      Extremities: no significant edema and no signs of DVT          Labs:   Results for orders placed during the hospital encounter of 10/03/12 (from the past 24 hour(s))  GLUCOSE, CAPILLARY     Status: Abnormal   Collection Time    10/04/12 10:46 AM      Result Value Range   Glucose-Capillary 100 (*) 70 - 99 mg/dL   Comment 1 Documented in Chart    GLUCOSE, CAPILLARY     Status: Abnormal   Collection Time    10/04/12  3:40 PM      Result Value Range   Glucose-Capillary 123 (*) 70 - 99 mg/dL   Comment 1 Documented in Chart    GLUCOSE, CAPILLARY     Status: Abnormal   Collection Time    10/04/12 10:56 PM      Result Value Range   Glucose-Capillary 146 (*) 70 - 99 mg/dL  GLUCOSE, CAPILLARY     Status: None   Collection Time    10/05/12  4:37 AM      Result Value Range   Glucose-Capillary 74  70 - 99 mg/dL  CBC     Status: Abnormal   Collection Time    10/05/12  5:15 AM      Result Value Range   WBC 6.5  4.0 - 10.5 K/uL   RBC 3.61 (*) 3.87 - 5.11 MIL/uL   Hemoglobin  8.9 (*) 12.0 - 15.0 g/dL   HCT 16.1 (*) 09.6 - 04.5 %   MCV 78.1  78.0 - 100.0 fL   MCH 24.7 (*) 26.0 - 34.0 pg   MCHC 31.6  30.0 - 36.0 g/dL   RDW 40.9  81.1 - 91.4 %   Platelets 236  150 - 400 K/uL  COMPREHENSIVE METABOLIC PANEL     Status: Abnormal   Collection Time    10/05/12  5:15 AM      Result Value Range   Sodium 138  135 - 145 mEq/L   Potassium 4.0  3.5 - 5.1 mEq/L   Chloride 106  96 - 112 mEq/L   CO2 20  19 - 32 mEq/L  Glucose, Bld 82  70 - 99 mg/dL   BUN 8  6 - 23 mg/dL   Creatinine, Ser 1.61  0.50 - 1.10 mg/dL   Calcium 9.5  8.4 - 09.6 mg/dL   Total Protein 6.1  6.0 - 8.3 g/dL   Albumin 2.2 (*) 3.5 - 5.2 g/dL   AST 15  0 - 37 U/L   ALT 7  0 - 35 U/L   Alkaline Phosphatase 131 (*) 39 - 117 U/L   Total Bilirubin 0.2 (*) 0.3 - 1.2 mg/dL   GFR calc non Af Amer >90  >90 mL/min   GFR calc Af Amer >90  >90 mL/min  LACTATE DEHYDROGENASE     Status: None   Collection Time    10/05/12  5:15 AM      Result Value Range   LDH 178  94 - 250 U/L  URIC ACID     Status: None   Collection Time    10/05/12  5:15 AM      Result Value Range   Uric Acid, Serum 6.0  2.4 - 7.0 mg/dL  Previous Hgb 9.3 on 3/1, 11.6 on 05/08/12  CBC at 9am 79.  24 hour urine protein = 390       Meds: None  A:  [redacted]w[redacted]d   Pre-eclampsia  Transverse lie              LGA fetus--EFW 11+ lbs at [redacted] weeks  Gestational diabetes--on Glyburide, held this am  GBS positive             Female circumcision  P:   Awaiting limited US this am to verify position.  Per patient report, if still transverse, will continue with plan for C/S at 2:15pm today.  If vtx, patient wants  induction.  Reviewed risks of shoulder dystocia, labor dystocia, prolonged labor/2nd stage, perineal trauma at  delivery requiring extensive repair.  Patient seems to understand these risks.  She will await [place] results.      Nigel Bridgeman CNM, [place] 10/05/2012 9:13 AM

## 2012-10-05 NOTE — Anesthesia Preprocedure Evaluation (Signed)
Anesthesia Evaluation  Patient identified by MRN, date of birth, ID band Patient awake    Reviewed: Allergy & Precautions, H&P , NPO status , Patient's Chart, lab work & pertinent test results  Airway Mallampati: III TM Distance: >3 FB Neck ROM: full    Dental no notable dental hx.    Pulmonary    Pulmonary exam normal       Cardiovascular     Neuro/Psych negative neurological ROS  negative psych ROS   GI/Hepatic negative GI ROS, Neg liver ROS,   Endo/Other  diabetes, Gestational, Oral Hypoglycemic AgentsMorbid obesity  Renal/GU negative Renal ROS  negative genitourinary   Musculoskeletal negative musculoskeletal ROS (+)   Abdominal (+) + obese,   Peds negative pediatric ROS (+)  Hematology negative hematology ROS (+)   Anesthesia Other Findings   Reproductive/Obstetrics (+) Pregnancy                           Anesthesia Physical Anesthesia Plan  ASA: III  Anesthesia Plan: Spinal   Post-op Pain Management:    Induction:   Airway Management Planned:   Additional Equipment:   Intra-op Plan:   Post-operative Plan:   Informed Consent: I have reviewed the patients History and Physical, chart, labs and discussed the procedure including the risks, benefits and alternatives for the proposed anesthesia with the patient or authorized representative who has indicated his/her understanding and acceptance.     Plan Discussed with: CRNA and Surgeon  Anesthesia Plan Comments:         Anesthesia Quick Evaluation

## 2012-10-05 NOTE — Anesthesia Postprocedure Evaluation (Signed)
Anesthesia Post Note  Patient: Jenny Marshall  Procedure(s) Performed: Procedure(s) (LRB): Primary cesarean section with delivery of baby boy 1650. Apgars 8/9. (N/A)  Anesthesia type: Spinal  Patient location: PACU  Post pain: Pain level controlled  Post assessment: Post-op Vital signs reviewed  Last Vitals:  Filed Vitals:   10/05/12 1547  BP: 146/79  Pulse: 97  Temp: 36.6 C  Resp: 16    Post vital signs: Reviewed  Level of consciousness: awake  Complications: No apparent anesthesia complications

## 2012-10-05 NOTE — OR Nursing (Addendum)
Uterus massaged by S. Satter[place] Charity fundraiser. Two tubes of cord blood sent to lab.  20cc of blood evacuated from uterus during uterine massage.

## 2012-10-05 NOTE — Anesthesia Procedure Notes (Signed)
Spinal  Patient location during procedure: OR Start time: 10/05/2012 4:15 PM End time: 10/05/2012 4:18 PM Staffing Anesthesiologist: Sandrea Hughs Performed by: anesthesiologist  Preanesthetic Checklist Completed: patient identified, site marked, surgical consent, pre-op evaluation, timeout performed, IV checked, risks and benefits discussed and monitors and equipment checked Spinal Block Patient position: sitting Prep: DuraPrep Patient monitoring: heart rate, cardiac monitor, continuous pulse ox and blood pressure Approach: midline Location: L3-4 Injection technique: single-shot Needle Needle type: Sprotte  Needle gauge: 24 G Needle length: 9 cm Needle insertion depth: 7 cm Assessment Sensory level: T4

## 2012-10-05 NOTE — Op Note (Signed)
Preoperative diagnosis: Intrauterine pregnancy at 37 weeks with pre-eclampsia, Class A2 diabetes, macrosomia and transverse lie  Post operative diagnosis: Same  Anesthesia: Spinal  Anesthesiologist: Dr. Arby Barrette  Procedure: Primary low transverse cesarean section  Surgeon: Dr. Dois Davenport Rivard  Assistant: Nigel Bridgeman CNM  Estimated blood loss: 1100 cc  Indication:  This is a 37 year old G3P2 who was admitted on 10/03/12 with complaints of headaches and new onset of hypertension. PIH work-up revealed proteinuria at 390 mg for 24 hours which confirmed the diagnosis of mild pre-eclampsia. The baby was also in transverse lie. Furthermore, due to Class A2 diabetes, this patient was followed with serial growth ultrasounds and fetal testing. On 10/01/12, at 36 +[redacted] weeks gestation, the estimated weight of the baby was 5120 g or 11 lbs and 2 oz. We counseled the patient about the need to proceed with delivery in light of the pre-eclampsia and recommended to proceed with Primary low transverse cesarean section due to macrosomia with increased risk of shoulder dystocia, per ACOG guidelines.  The procedure with its risks and benefits was reviewed with the patient who consented to proceed with our plan.  Procedure:  After being informed of the planned procedure and possible complications including bleeding, infection, injury to other organs, informed consent is obtained. The patient is taken to OR #9 and given spinal anesthesia without complication. She is placed in the dorsal decubitus position with the pelvis tilted to the left. She is then prepped and draped in a sterile fashion. A Foley catheter is inserted in her bladder.  After assessing adequate level of anesthesia, we infiltrate the suprapubic area with 20 cc of Marcaine 0.25 and perform a Pfannenstiel incision which is brought down sharply to the fascia. The fascia is entered in a low transverse fashion. Linea alba is dissected. Peritoneum is entered  in a midline fashion. An Alexis retractor is easily positioned.   The myometrium is then entered in a low transverse fashion, 2 cm above the vesico-uterine junction ; first with knife and then extended bluntly. Amniotic fluid is clear and abundant. We assist the birth of a female  infant in complete breech  presentation. The baby is delivered. Mouth and nose are suctioned. The cord is clamped and sectioned. The baby is given to the neonatologist present in the room.  10 cc of blood is drawn from the umbilical vein.The placenta is allowed to deliver spontaneously. It is complete and the cord has 3 vessels. Uterine revision is negative.  We proceed with closure of the myometrium in 2 layers: First with a running locked suture of 0 Vicryl, then with a Lembert suture of 0 Vicryl imbricating the first one. Hemostasis is completed with cauterization on peritoneal edges.  Both paracolic gutters are cleaned. Both tubes and ovaries are assessed and normal. The pelvis is profusely irrigated with warm saline to confirm a satisfactory hemostasis.  Retractors and sponges are removed. Under fascia hemostasis is completed with cauterization. The fascia is then closed with 2 running sutures of 0 Vicryl meeting midline. The wound is irrigated with warm saline and hemostasis is completed with cauterization. The skin is closed with a subcuticular suture of 3-0 Monocryl and Steri-Strips.  Instrument and sponge count is complete x2. Estimated blood loss is 1100 cc.  The procedure is well tolerated by the patient who is taken to recovery room in a well and stable condition.  female baby named  was born at [place] and received an Apgar of 8  at 1 minute  and 9 at 5 minutes.    Specimen: Placenta sent to pathology  [facility] A MD 3/3/20145:23 PM

## 2012-10-05 NOTE — Transfer of Care (Signed)
Immediate Anesthesia Transfer of Care Note  Patient: Jenny Marshall  Procedure(s) Performed: Procedure(s): Primary cesarean section with delivery of baby boy 1650. Apgars 8/9. (N/A)  Patient Location: PACU  Anesthesia Type:Spinal  Level of Consciousness: awake, alert  and oriented  Airway & Oxygen Therapy: Patient Spontanous Breathing  Post-op Assessment: Report given to PACU RN and Post -op Vital signs reviewed and stable  Post vital signs: stable  Complications: No apparent anesthesia complications

## 2012-10-06 ENCOUNTER — Encounter (HOSPITAL_COMMUNITY): Payer: Self-pay | Admitting: Obstetrics and Gynecology

## 2012-10-06 LAB — CBC
MCV: 78.1 fL (ref 78.0–100.0)
Platelets: 193 10*3/uL (ref 150–400)
RDW: 15.3 % (ref 11.5–15.5)
WBC: 8.2 10*3/uL (ref 4.0–10.5)

## 2012-10-06 LAB — COMPREHENSIVE METABOLIC PANEL
AST: 20 U/L (ref 0–37)
Albumin: 1.8 g/dL — ABNORMAL LOW (ref 3.5–5.2)
Chloride: 102 mEq/L (ref 96–112)
Creatinine, Ser: 0.43 mg/dL — ABNORMAL LOW (ref 0.50–1.10)
Total Bilirubin: 0.2 mg/dL — ABNORMAL LOW (ref 0.3–1.2)

## 2012-10-06 LAB — GLUCOSE, CAPILLARY
Glucose-Capillary: 92 mg/dL (ref 70–99)
Glucose-Capillary: 111 mg/dL — ABNORMAL HIGH (ref 70–99)

## 2012-10-06 MED ORDER — SODIUM CHLORIDE 0.9 % IJ SOLN
3.0000 mL | Freq: Two times a day (BID) | INTRAMUSCULAR | Status: DC
  Administered 2012-10-06 – 2012-10-07 (×2): 3 mL via INTRAVENOUS

## 2012-10-06 MED ORDER — SODIUM CHLORIDE 0.9 % IJ SOLN
3.0000 mL | INTRAMUSCULAR | Status: DC | PRN
  Administered 2012-10-07: 3 mL via INTRAVENOUS

## 2012-10-06 MED ORDER — LACTATED RINGERS IV SOLN
INTRAVENOUS | Status: AC
  Administered 2012-10-06: 14:00:00 via INTRAVENOUS

## 2012-10-06 MED ORDER — NIFEDIPINE ER 30 MG PO TB24
30.0000 mg | ORAL_TABLET | Freq: Every day | ORAL | Status: DC
  Administered 2012-10-06 – 2012-10-07 (×2): 30 mg via ORAL
  Filled 2012-10-06 (×3): qty 1

## 2012-10-06 NOTE — Progress Notes (Signed)
Patient ID: Jenny Marshall, female   DOB: July 04, 1976, 37 y.o.   MRN: [id number] Subjective: Postpartum Day 1: Cesarean Delivery Patient reports No  nausea, vomiting and incisional pain.  Tolerating po's.  Some dizziness  Objective: Vital signs in last 24 hours: Temp:  [97.6 F (36.4 C)-98.9 F (37.2 C)] 97.7 F (36.5 C) (03/04 0750) Pulse Rate:  [80-109] 100 (03/04 1003) Resp:  [14-23] 18 (03/04 1003) BP: (128-169)/(70-105) 141/79 mmHg (03/04 1002) SpO2:  [94 %-100 %] 98 % (03/04 1003) Weight:  [266 lb 6.4 oz (120.838 kg)] 266 lb 6.4 oz (120.838 kg) (03/04 0525) I/O last 3 completed shifts: In: 4856.7 [P.O.:1420; I.V.:3436.7] Out: 2900 [Urine:1800; Blood:1100] Total I/O In: 1095 [P.O.:720; I.V.:375] Out: 1200 [Urine:1200]  Results for orders placed during the hospital encounter of 10/03/12 (from the past 24 hour(s))  GLUCOSE, CAPILLARY     Status: Abnormal   Collection Time    10/05/12  4:02 PM      Result Value Range   Glucose-Capillary 65 (*) 70 - 99 mg/dL  GLUCOSE, CAPILLARY     Status: None   Collection Time    10/05/12  5:53 PM      Result Value Range   Glucose-Capillary 84  70 - 99 mg/dL  URIC ACID     Status: None   Collection Time    10/05/12  8:17 PM      Result Value Range   Uric Acid, Serum 6.4  2.4 - 7.0 mg/dL  CBC     Status: Abnormal   Collection Time    10/06/12  5:25 AM      Result Value Range   WBC 8.2  4.0 - 10.5 K/uL   RBC 3.19 (*) 3.87 - 5.11 MIL/uL   Hemoglobin 7.9 (*) 12.0 - 15.0 g/dL   HCT 40.9 (*) 81.1 - 91.4 %   MCV 78.1  78.0 - 100.0 fL   MCH 24.8 (*) 26.0 - 34.0 pg   MCHC 31.7  30.0 - 36.0 g/dL   RDW 78.2  95.6 - 21.3 %   Platelets 193  150 - 400 K/uL  COMPREHENSIVE METABOLIC PANEL     Status: Abnormal   Collection Time    10/06/12  5:25 AM      Result Value Range   Sodium 133 (*) 135 - 145 mEq/L   Potassium 3.8  3.5 - 5.1 mEq/L   Chloride 102  96 - 112 mEq/L   CO2 23  19 - 32 mEq/L   Glucose, Bld 91  70 - 99 mg/dL   BUN 6  6 - 23  mg/dL   Creatinine, Ser 0.86 (*) 0.50 - 1.10 mg/dL   Calcium 8.2 (*) 8.4 - 10.5 mg/dL   Total Protein 5.2 (*) 6.0 - 8.3 g/dL   Albumin 1.8 (*) 3.5 - 5.2 g/dL   AST 20  0 - 37 U/L   ALT 7  0 - 35 U/L   Alkaline Phosphatase 103  39 - 117 U/L   Total Bilirubin 0.2 (*) 0.3 - 1.2 mg/dL   GFR calc non Af Amer >90  >90 mL/min   GFR calc Af Amer >90  >90 mL/min  GLUCOSE, CAPILLARY     Status: None   Collection Time    10/06/12  5:25 AM      Result Value Range   Glucose-Capillary 92  70 - 99 mg/dL  GLUCOSE, CAPILLARY     Status: Abnormal   Collection Time    10/06/12 10:13 AM  Result Value Range   Glucose-Capillary 111 (*) 70 - 99 mg/dL   Physical Exam:  General: alert, cooperative, appears stated age, no distress and moderately obese Lochia: appropriate Uterine Fundus: firm Incision: dressing dry DVT Evaluation: No evidence of DVT seen on physical exam.   Assessment/Plan: Status post Cesarean section. Doing well postoperatively.  Chronic anemia Mild orthstatic sx, will check orthostatic VS Pre-eclampsia with improved BPs GDM, cannot r/o type II DM.  Has strong family hx of DM Continue current care  Pt wants to continue carb modified diet but will d/c cbgs . D/C magnesium at 1830 and transfer to floor if stable D/C scopalomine  HAYGOOD,VANESSA P 10/06/2012, 11:21 AM

## 2012-10-06 NOTE — Progress Notes (Signed)
UR chart review completed.  

## 2012-10-06 NOTE — Anesthesia Postprocedure Evaluation (Signed)
Anesthesia Post Note  Patient: Jenny Marshall  Procedure(s) Performed: Procedure(s) (LRB): Primary cesarean section with delivery of baby boy 1650. Apgars 8/9. (N/A)  Anesthesia type: SAB  Patient location: Mother/Baby  Post pain: Pain level controlled  Post assessment: Post-op Vital signs reviewed  Last Vitals:  Filed Vitals:   10/06/12 0750  BP:   Pulse:   Temp: 36.5 C  Resp:     Post vital signs: Reviewed  Level of consciousness: awake  Complications: No apparent anesthesia complications

## 2012-10-07 NOTE — Progress Notes (Signed)
Pt transferred ambulatory to [facility] rm @141  (rm assignment changed) - report updated to Wallie Renshaw, RN

## 2012-10-07 NOTE — Progress Notes (Signed)
Subjective: Postpartum Day 2: Cesarean Delivery due to transverse lie; LGA On Mag for 24 hours d/t Pre-E; transferred to [facility] this am Patient up ad lib, reports no syncope or dizziness. Feeding:  Breast Contraceptive plan:  Micronor  Objective: Vital signs in last 24 hours: Temp:  [97.9 F (36.6 C)-98.3 F (36.8 C)] 98.2 F (36.8 C) (03/05 0721) Pulse Rate:  [52-108] 83 (03/05 0644) Resp:  [16-20] 20 (03/05 0721) BP: (134-181)/(70-114) 159/88 mmHg (03/05 0644) SpO2:  [96 %-100 %] 98 % (03/05 0644) Weight:  [262 lb 3.2 oz (098.119 kg)] 262 lb 3.2 oz (118.933 kg) (03/05 0721)  Filed Vitals:   10/07/12 0109 10/07/12 0335 10/07/12 0644 10/07/12 0721  BP: 144/89 155/90 159/88   Pulse: 83 52 83   Temp:    98.2 F (36.8 C)  TempSrc:    Oral  Resp: 18 20 20 20   Height:    5\' 9"  (1.753 m)  Weight:    262 lb 3.2 oz (118.933 kg)  SpO2: 97% 96% 98%    Currently on Procardia 30mg  XL; first dose 21:30 yesterday  Physical Exam:  General: alert Lochia: appropriate Uterine Fundus: firm Abdomen: tympanic distention; soft; NT; passing flatus Incision: healing well, sm amount of drainage on dsg, marked by RN DVT Evaluation: No evidence of DVT seen on physical exam. Negative Homan's sign. JP drain:   n/a   Recent Labs  10/05/12 0515 10/06/12 0525  HGB 8.9* 7.9*  HCT 28.2* 24.9*  Pre-delivery hgb 9.3  CBG (last 3)   Recent Labs  10/05/12 1753 10/06/12 0525 10/06/12 1013  GLUCAP 84 92 111*     Assessment/Plan: Status post Cesarean section day 2. Postoperative course complicated by Pre-E; anemia; GDM   Continue current care. Procardia 30mg  XL Will continue to monitor BP, and medication will be adjusted as appropriate Pt wants to continue carb modified diet Declines transfusion; will continue iron supplementation; rv'd option of Floridex d/t less incidence of constipation Plan for discharge tomorrow    Nigel Bridgeman 10/07/2012, 10:39 AM

## 2012-10-08 LAB — TYPE AND SCREEN: Unit division: 0

## 2012-10-08 MED ORDER — POLYSACCHARIDE IRON COMPLEX 150 MG PO CAPS
150.0000 mg | ORAL_CAPSULE | Freq: Two times a day (BID) | ORAL | Status: DC
  Administered 2012-10-08 – 2012-10-09 (×4): 150 mg via ORAL
  Filled 2012-10-08 (×6): qty 1

## 2012-10-08 MED ORDER — NIFEDIPINE ER 60 MG PO TB24
60.0000 mg | ORAL_TABLET | Freq: Every day | ORAL | Status: DC
  Administered 2012-10-08 – 2012-10-09 (×2): 60 mg via ORAL
  Filled 2012-10-08 (×4): qty 1

## 2012-10-08 NOTE — Progress Notes (Signed)
Patient ID: Jenny Marshall, female   DOB: 04/24/1976, 37 y.o.   MRN: [id number] Subjective: Postpartum Day 3: Cesarean Delivery secondary to: pre-eclampsia, LGA  Patient reports tolerating PO, + flatus and no problems voiding.  Denies PIH sx's  no complaints, up ad lib without syncope Pain well controlled with po meds BF well  Mood stable, bonding well Contraception: micronor  Infant to stay overnight for bili lights   Objective: Vital signs in last 24 hours: Temp:  [98.6 F (37 C)-99.3 F (37.4 C)] 98.6 F (37 C) (03/06 0555) Pulse Rate:  [84-98] 98 (03/06 0555) Resp:  [18-20] 20 (03/06 0555) BP: (137-149)/(85-94) 149/94 mmHg (03/06 0555) SpO2:  [99 %] 99 % (03/05 1200)  Filed Vitals:   10/07/12 0721 10/07/12 1200 10/07/12 1751 10/08/12 0555  BP:  137/85 143/94 149/94  Pulse:  84 96 98  Temp: 98.2 F (36.8 C) 99.3 F (37.4 C) 99 F (37.2 C) 98.6 F (37 C)  TempSrc: Oral  Oral Oral  Resp: 20 18 20 20   Height: 5\' 9"  (1.753 m)     Weight: 262 lb 3.2 oz (118.933 kg)     SpO2:  99%      Physical Exam:  General: no distress Heart: RRR Lungs: CTAB Abdomen: BS x4 Uterine Fundus: firm Incision: dressing intact, sm amt old drainage    Lochia: appropriate DVT Evaluation: No evidence of DVT seen on physical exam. Negative Homan's sign. Mild 1+ LEE    Recent Labs  10/06/12 0525  HGB 7.9*  HCT 24.9*   CBG (last 3)   Recent Labs  10/05/12 1753 10/06/12 0525 10/06/12 1013  GLUCAP 84 92 111*      Assessment/Plan: Status post Cesarean section. Doing well postoperatively.  Continue current care. Procardia was raised to 60mg  XL this AM S/p mag sulfate for pre-eclampsia (moved to floor yesterday) Anemia hemodynamically stable Gestational diabetic - will d/c CBG's  Plan d/c tmrw  Change FE to niferex, rec floridix    Jenny Marshall M 10/08/2012, 10:26 AM

## 2012-10-09 MED ORDER — NORETHINDRONE 0.35 MG PO TABS: 1.0000 | ORAL_TABLET | Freq: Every day | ORAL | Status: DC

## 2012-10-09 MED ORDER — OXYCODONE-ACETAMINOPHEN 5-325 MG PO TABS: 1.0000 | ORAL_TABLET | ORAL | Status: DC | PRN

## 2012-10-09 MED ORDER — IBUPROFEN 600 MG PO TABS: 600.0000 mg | ORAL_TABLET | Freq: Four times a day (QID) | ORAL | Status: DC

## 2012-10-09 MED ORDER — NIFEDIPINE ER 60 MG PO TB24: 60.0000 mg | ORAL_TABLET | Freq: Every day | ORAL | Status: DC

## 2012-10-09 NOTE — Progress Notes (Signed)
S: Pt sleeping, left undisturbed at this time O: Vital signs reviewed, appear to be stable A: POD4, s/p primary c/s, s/p mag sulfate for pre-eclampsia P: will come back to reassess pt later, infant is to remain a pt for continued phototherapy

## 2012-10-12 ENCOUNTER — Telehealth: Payer: Self-pay | Admitting: Obstetrics and Gynecology

## 2012-10-12 NOTE — Discharge Summary (Signed)
Cesarean Section Delivery Discharge Summary  Jenny Marshall  DOB:    Aug 22, 1975 MRN:    [id number] CSN:    [id number]  Date of admission:                  10/03/2012  Date of discharge:                   10/09/2012  Procedures this admission:  NST, serial BP's, 24 hour urine, CBG's, PIH labs, primary C/S, 24hours of mag sulfate infusion, procardia for hypertension   Newborn Data:  Live born female  Birth Weight: 11 lb 6.9 oz (5185 g) APGAR: 8, 9  Infant remains in central nursery for continued phototherapy (dc'd on 10/11/2012)  Name: Jenny Marshall (declined circumcision)   History of Present Illness:  Ms. Jenny Marshall is a 37 y.o. female, [id number], who presents at [redacted]w[redacted]d weeks gestation. The patient has been followed at the [facility] and Gynecology division of [facility].    Her pregnancy has been complicated by:  Gestational diabetes - on glyburide macrosomic infant Transverse lie of infant pre-eclampsia   Hospital course:  The patient was admitted for observation secondary to headache with elevated BP at [redacted]w[redacted]d. 24 hour urine was obtained which contained >300mg  protein, the following day at 37wks, infant remained in transverse lie and it was recommended to proceed with primary c/s performed by Dr. Estanislado Pandy. CBG's were also monitored and due to fasting hypoglycemia PM dose of glyburide decreased, although there was some suspicion of dietary non-compliance at home. CBG's remained stable after delivery and glyburide was dc'd. Mag Sulfate was initiated after delivery and continued for 24 hours.  Pt was then tx'd for postpartum for rest of her stay.  Her postpartum course was complicated by postpartum anemia, and continued hypertension, procardia PO was initiated at 30mg  XL, and on day 2 was increased to 60mg  XL. She remained hemodynamically stable and did not require blood transfusion.  She was discharged to home on postpartum day 4 doing well.    Feeding:  breast  Contraception:  micronor  Discharge hemoglobin:  Hemoglobin  Date Value Range Status  10/06/2012 7.9* 12.0 - 15.0 g/dL Final     HCT  Date Value Range Status  10/06/2012 24.9* 36.0 - 46.0 % Final    Discharge Physical Exam:   General: no distress Lochia: appropriate Uterine Fundus: firm Incision: honeycomb dressing remains CDI  DVT Evaluation: No evidence of DVT seen on physical exam. Negative Homan's sign. Calf/Ankle edema is present. (mild, bilateral lower extremity)   Intrapartum Procedures: primary LTCS, mag sulfate for 24 hours, PIH labs  Postpartum Procedures: none Complications-Operative and Postpartum: anemia, pre-eclampsia with continued hypertension requiring procardia PO   Discharge Diagnoses: Preelampsia and anemia, LGA, s/p primary LTCS  Discharge Information:  Activity:           pelvic rest Diet:                routine and iron rich Medications: PNV, Ibuprofen, Iron, Percocet and procardia XL 60mg  PO, floridex  Condition:      stable Instructions:  refer to practice specific booklet Discharge to: home  Follow-up Information   Follow up with [facility] In 1 week. (A nurse will arrange to come check your blood pressure at your home the beginning of next week. Call to schedule appointment for 1-2 weeks for BP check. )    Contact information:   [address]  [place] [address] [phone]       Jenny Marshall 10/12/2012

## 2012-10-12 NOTE — Telephone Encounter (Signed)
TC to pt. LM to return call.ASAP.  

## 2012-10-12 NOTE — Telephone Encounter (Signed)
Message copied by Mason Jim on Mon Oct 12, 2012  8:53 AM ------      Message from: Malissa Hippo.      Created: Fri Oct 09, 2012  6:13 PM      Regarding: Please call to arrange for smart start to check BP on pt discharged today       She has pre-eclampsia, is being discharged on procardia 60mg  XL, BP's have been 140's-150's/90's             Too late for hospital to call smart start today             Monday or Tuesday is fine      Thanks      SL  ------

## 2012-10-12 NOTE — Telephone Encounter (Signed)
TC to [place] at [facility]. [place] No nurse available for Home Visit. TC to pt. LM to return call to schedule appt.

## 2012-10-13 ENCOUNTER — Ambulatory Visit: Payer: Medicaid Other | Admitting: Family Medicine

## 2012-10-13 ENCOUNTER — Telehealth: Payer: Self-pay | Admitting: Obstetrics and Gynecology

## 2012-10-13 ENCOUNTER — Encounter: Payer: Self-pay | Admitting: Family Medicine

## 2012-10-13 VITALS — BP 122/82 | Ht 69.0 in | Wt 251.0 lb

## 2012-10-13 DIAGNOSIS — I10 Essential (primary) hypertension: Secondary | ICD-10-CM

## 2012-10-13 NOTE — Telephone Encounter (Signed)
Returned pt's call. SChed for BP check today with LC. Pt denies PIH sx. Just feeling tired.

## 2012-10-13 NOTE — Telephone Encounter (Signed)
Message copied by Mason Jim on Tue Oct 13, 2012  9:46 AM ------      Message from: Malissa Hippo.      Created: Fri Oct 09, 2012  6:13 PM      Regarding: Please call to arrange for [facility] to check BP on pt discharged today       She has pre-eclampsia, is being discharged on procardia 60mg  XL, BP's have been 140's-150's/90's             Too late for hospital to call smart [facility] today             Monday or Tuesday is fine      Thanks      SL  ------

## 2012-10-13 NOTE — Telephone Encounter (Signed)
Note to close enc. Oakley, Jacqueline A  

## 2012-10-13 NOTE — Telephone Encounter (Signed)
TC to pt. LM to return call.ASAP.

## 2012-10-13 NOTE — Progress Notes (Signed)
BP 122/82  Ht 5\' 9"  (1.753 m)  Wt 251 lb (113.853 kg)  BMI 37.05 kg/m2  S: Patient fell better.  Denies HA, BV, dizziness.  Unable to received help from [facility] due to address.  BP cuff at home ranages 140's/90's.  Minimal swelling noted but usually decreases in the mornings.  O:  Pt sitting in bedside chair, NAD breastfeeding. 1+ Edema noted bilaterally.  No facial edema.  A: Postpartum HTN  P: Continue Procardia as ordered.      F/U on 3/20, call the office if any increase in BP >1600/100, change in vision or headaches.   L.Carter, FNP-BC

## 2013-04-20 ENCOUNTER — Inpatient Hospital Stay (HOSPITAL_COMMUNITY): Payer: BC Managed Care – PPO

## 2013-04-20 ENCOUNTER — Encounter (HOSPITAL_COMMUNITY): Payer: Self-pay | Admitting: *Deleted

## 2013-04-20 ENCOUNTER — Inpatient Hospital Stay (HOSPITAL_COMMUNITY)
Admission: AD | Admit: 2013-04-20 | Discharge: 2013-04-20 | Disposition: A | Discharge status: Home / Self Care | Payer: BC Managed Care – PPO | Source: Ambulatory Visit | Attending: Obstetrics and Gynecology | Admitting: Obstetrics and Gynecology

## 2013-04-20 DIAGNOSIS — O039 Complete or unspecified spontaneous abortion without complication: Secondary | ICD-10-CM | POA: Insufficient documentation

## 2013-04-20 LAB — CBC
Hemoglobin: 11.6 g/dL — ABNORMAL LOW (ref 12.0–15.0)
MCH: 28 pg (ref 26.0–34.0)
MCHC: 32.8 g/dL (ref 30.0–36.0)
RDW: 14.5 % (ref 11.5–15.5)

## 2013-04-20 LAB — URINALYSIS, ROUTINE W REFLEX MICROSCOPIC
Glucose, UA: NEGATIVE mg/dL
Ketones, ur: NEGATIVE mg/dL
Leukocytes, UA: NEGATIVE
Nitrite: NEGATIVE
Protein, ur: NEGATIVE mg/dL
Urobilinogen, UA: 0.2 mg/dL (ref 0.0–1.0)

## 2013-04-20 LAB — HCG, QUANTITATIVE, PREGNANCY: hCG, Beta Chain, Quant, S: 2982 m[IU]/mL — ABNORMAL HIGH (ref <5)

## 2013-04-20 LAB — POCT PREGNANCY, URINE: Preg Test, Ur: POSITIVE — AB

## 2013-04-20 MED ORDER — IBUPROFEN 600 MG PO TABS: 600.0000 mg | ORAL_TABLET | Freq: Once | ORAL | Status: DC

## 2013-04-20 MED ORDER — IBUPROFEN 600 MG PO TABS
600.0000 mg | ORAL_TABLET | Freq: Once | ORAL | Status: AC
  Administered 2013-04-20: 600 mg via ORAL
  Filled 2013-04-20: qty 1

## 2013-04-20 NOTE — MAU Note (Signed)
Pt states she had a large amount vaginal bleeding.Pt states she passed a large blood clot that had tissue

## 2013-04-20 NOTE — Progress Notes (Signed)
Speculum examine performed to observe vaginal bleeding

## 2013-04-20 NOTE — MAU Provider Note (Signed)
History     CSN: [id number]  Arrival date and time: 04/20/13 1543   First Provider Initiated Contact with Patient 04/20/13 1949      Chief Complaint  Patient presents with  . Vaginal Bleeding   HPI  Jenny Marshall is a 37 y.o. female; [phone] at [redacted]w[redacted]d who presents to MAU with possible pregnancy and vaginal bleeding. I was asked by [facility] to see this patient and evaluate her. She began bleeding yesterday and did not know she was pregnant. She is very concerned because she passed a large clot; that appeared to be a gestational sac. She is bleeding now; however it is light. She denies abdominal pain, + lower back pain. Pt brought in pictures of the questionable gestational sac that she passed last night at home in which I reviewed and agree that it appears to be suspicious for a gestational sac.  OB History   Grav Para Term Preterm Abortions TAB SAB Ect Mult Living   4 3 3       3       Past Medical History  Diagnosis Date  . Shortness of breath     AGE 49-21; WITH STRESS  . Irritable bowel     X SEVERAL YEARS;  RESOLVED 2006  . Varicose veins   . Anginal pain     AGE 49-21; HAD EVAL; NO DX; OCCURS WITH STRSS ONT BLOOD THINNERS AGE 55  . Anemia     CHRONIC  . H/O varicella   . Low iron   . Obesity   . Gestational diabetes 2014    current pregnancy    Past Surgical History  Procedure Laterality Date  . Vulva surgery  at age 66    clitorectomy  . Cesarean section N/A 10/05/2012    Procedure: Primary cesarean section with delivery of baby boy 1650. Apgars 8/9.;  Surgeon: Esmeralda Arthur, MD;  Location: [facility];  Service: Obstetrics;  Laterality: N/A;    Family History  Problem Relation Age of Onset  . Hypertension Father   . Hyperlipidemia Paternal Aunt   . Diabetes Paternal Aunt   . Hypertension Paternal Aunt   . Kidney disease Paternal Aunt   . Hyperlipidemia Paternal Uncle   . Diabetes Paternal Uncle   . Hypertension Paternal Uncle   . Stroke  Paternal Uncle   . Rheum arthritis Paternal Uncle   . Arthritis Maternal Grandmother   . Diabetes Maternal Grandmother   . Hyperlipidemia Maternal Grandmother   . Other Maternal Grandmother     VARICOSE VEINS  . Heart disease Maternal Grandfather   . Vision loss Paternal Grandfather     GLAUCOMA    History  Substance Use Topics  . Smoking status: Never Smoker   . Smokeless tobacco: Never Used  . Alcohol Use: No    Allergies:  Allergies  Allergen Reactions  . Prednisone     PRICKLY FEELING;     Prescriptions prior to admission  Medication Sig Dispense Refill  . acetaminophen (TYLENOL) 500 MG tablet Take 1,000 mg by mouth every 6 (six) hours as needed for pain (back pain).      . [DISCONTINUED] alum & mag hydroxide-simeth (MAALOX/MYLANTA) 200-200-20 MG/5ML suspension Take 15 mLs by mouth as needed for indigestion.      . [DISCONTINUED] calcium carbonate (TUMS - DOSED IN MG ELEMENTAL CALCIUM) 500 MG chewable tablet Chew 1 tablet by mouth at bedtime as needed for heartburn.      . [DISCONTINUED] docusate sodium (  COLACE) 100 MG capsule Take 100 mg by mouth daily as needed. OTC      . [DISCONTINUED] ibuprofen (ADVIL,MOTRIN) 600 MG tablet Take 1 tablet (600 mg total) by mouth every 6 (six) hours.  30 tablet  2  . [DISCONTINUED] NIFEdipine (PROCARDIA-XL/ADALAT CC) 60 MG 24 hr tablet Take 1 tablet (60 mg total) by mouth daily.  30 tablet  2  . [DISCONTINUED] norethindrone (ORTHO MICRONOR) 0.35 MG tablet Take 1 tablet (0.35 mg total) by mouth daily.  1 Package  11  . [DISCONTINUED] oxyCODONE-acetaminophen (PERCOCET/ROXICET) 5-325 MG per tablet Take 1-2 tablets by mouth every 4 (four) hours as needed.  30 tablet  0  . [DISCONTINUED] Prenatal Vit-Fe Fumarate-FA (PRENATAL MULTIVITAMIN) TABS Take 1 tablet by mouth daily.       Results for orders placed during the hospital encounter of 04/20/13 (from the past 24 hour(s))  URINALYSIS, ROUTINE W REFLEX MICROSCOPIC     Status: Abnormal    Collection Time    04/20/13  4:05 PM      Result Value Range   Color, Urine YELLOW  YELLOW   APPearance HAZY (*) CLEAR   Specific Gravity, Urine >1.030 (*) 1.005 - 1.030   pH 5.5  5.0 - 8.0   Glucose, UA NEGATIVE  NEGATIVE mg/dL   Hgb urine dipstick LARGE (*) NEGATIVE   Bilirubin Urine NEGATIVE  NEGATIVE   Ketones, ur NEGATIVE  NEGATIVE mg/dL   Protein, ur NEGATIVE  NEGATIVE mg/dL   Urobilinogen, UA 0.2  0.0 - 1.0 mg/dL   Nitrite NEGATIVE  NEGATIVE   Leukocytes, UA NEGATIVE  NEGATIVE  URINE MICROSCOPIC-ADD ON     Status: Abnormal   Collection Time    04/20/13  4:05 PM      Result Value Range   Squamous Epithelial / LPF FEW (*) RARE   WBC, UA 3-6  <3 WBC/hpf   RBC / HPF TOO NUMEROUS TO COUNT  <3 RBC/hpf   Bacteria, UA FEW (*) RARE   Urine-Other MUCOUS PRESENT    POCT PREGNANCY, URINE     Status: Abnormal   Collection Time    04/20/13  4:29 PM      Result Value Range   Preg Test, Ur POSITIVE (*) NEGATIVE  CBC     Status: Abnormal   Collection Time    04/20/13  7:00 PM      Result Value Range   WBC 4.8  4.0 - 10.5 K/uL   RBC 4.15  3.87 - 5.11 MIL/uL   Hemoglobin 11.6 (*) 12.0 - 15.0 g/dL   HCT 81.1 (*) 91.4 - 78.2 %   MCV 85.3  78.0 - 100.0 fL   MCH 28.0  26.0 - 34.0 pg   MCHC 32.8  30.0 - 36.0 g/dL   RDW 95.6  21.3 - 08.6 %   Platelets 277  150 - 400 K/uL  HCG, QUANTITATIVE, PREGNANCY     Status: Abnormal   Collection Time    04/20/13  7:00 PM      Result Value Range   hCG, Beta Chain, Quant, S 2982 (*) <5 mIU/mL   US Ob Comp Less 14 Wks  04/20/2013   *RADIOLOGY REPORT*  Clinical Data: Vaginal bleeding, positive pregnancy test  OBSTETRIC <14 WK [place] AND TRANSVAGINAL OB US  Technique:  Both transabdominal and transvaginal ultrasound examinations were performed for complete evaluation of the gestation as well as the maternal uterus, adnexal regions, and pelvic cul-de-sac.  Transvaginal technique was performed to assess early  pregnancy.  Comparison:  Prior obstetrical  ultrasound 10/05/2012  Intrauterine gestational sac:  None visualized Yolk sac: None Embryo: None Cardiac Activity: None  Maternal uterus/adnexae: Unremarkable sonographic appearance of the uterus. Mildly thickened and heterogeneous endometrial stripe measuring up to 23 mm.  No endometrial fluid. Unremarkable right ovary measures 3.3 x 2.7 x 1.8 cm.  Vascular flow demonstrated on power Doppler.  Unremarkable left ovary measures 2.5 x 2.5 x 1.7 cm.  Color flow identified by power Doppler.  Probable corpus luteum on the left.  No free fluid in the pelvis.  IMPRESSION:  No intrauterine gestational sac identified.  In the setting of a positive pregnancy test, differential considerations include very early intrauterine pregnancy, failed intrauterine pregnancy and ectopic pregnancy.  Recommend clinical correlation with serial beta HCG.   Original Report Authenticated By: Malachy Moan, M.D.   US Ob Transvaginal  04/20/2013   *RADIOLOGY REPORT*  Clinical Data: Vaginal bleeding, positive pregnancy test  OBSTETRIC <14 WK [place] AND TRANSVAGINAL OB US  Technique:  Both transabdominal and transvaginal ultrasound examinations were performed for complete evaluation of the gestation as well as the maternal uterus, adnexal regions, and pelvic cul-de-sac.  Transvaginal technique was performed to assess early pregnancy.  Comparison:  Prior obstetrical ultrasound 10/05/2012  Intrauterine gestational sac:  None visualized Yolk sac: None Embryo: None Cardiac Activity: None  Maternal uterus/adnexae: Unremarkable sonographic appearance of the uterus. Mildly thickened and heterogeneous endometrial stripe measuring up to 23 mm.  No endometrial fluid. Unremarkable right ovary measures 3.3 x 2.7 x 1.8 cm.  Vascular flow demonstrated on power Doppler.  Unremarkable left ovary measures 2.5 x 2.5 x 1.7 cm.  Color flow identified by power Doppler.  Probable corpus luteum on the left.  No free fluid in the pelvis.  IMPRESSION:  No intrauterine  gestational sac identified.  In the setting of a positive pregnancy test, differential considerations include very early intrauterine pregnancy, failed intrauterine pregnancy and ectopic pregnancy.  Recommend clinical correlation with serial beta HCG.   Original Report Authenticated By: Malachy Moan, M.D.    Review of Systems  Constitutional: Positive for chills. Negative for fever.  Musculoskeletal: Positive for back pain.  Neurological: Positive for weakness. Negative for dizziness.   Physical Exam   Blood pressure 127/85, pulse 82, temperature 98 F (36.7 C), temperature source Oral, resp. rate 18, height 5' 8.5" (1.74 m), weight 108.319 kg (238 lb 12.8 oz), last menstrual period 03/07/2013.  Physical Exam  Constitutional: She is oriented to person, place, and time. She appears well-developed and well-nourished. No distress.  Neck: Neck supple.  Respiratory: Effort normal.  GI: Soft. She exhibits no distension. There is no tenderness. There is no rebound and no guarding.  Genitourinary:  Speculum exam: Vagina - Moderate amount of bright red blood in vaginal canal, no odor Cervix - Moderate blood from cervical os Bimanual exam: Cervix : 1 cm, anterior  Uterus non tender, normal size for gestational age  Adnexa non tender, no masses bilaterally Chaperone present for exam.   Neurological: She is alert and oriented to person, place, and time.  Skin: Skin is warm. She is not diaphoretic.    MAU Course  Procedures  MDM Beta Hcg CBC [place]  O positive blood type Report given to [place] CNM who resumes care of this patient   Assessment and Plan   Debbrah Alar FNP-C 04/20/2013, 9:29 PM   Care of patient assumed by Dorathy Kinsman, CNM at 2100. In ultrasound.  US Ob Comp  Less 14 Wks  04/20/2013   *RADIOLOGY REPORT*  Clinical Data: Vaginal bleeding, positive pregnancy test  OBSTETRIC <14 WK [place] AND TRANSVAGINAL OB US  Technique:  Both transabdominal and  transvaginal ultrasound examinations were performed for complete evaluation of the gestation as well as the maternal uterus, adnexal regions, and pelvic cul-de-sac.  Transvaginal technique was performed to assess early pregnancy.  Comparison:  Prior obstetrical ultrasound 10/05/2012  Intrauterine gestational sac:  None visualized Yolk sac: None Embryo: None Cardiac Activity: None  Maternal uterus/adnexae: Unremarkable sonographic appearance of the uterus. Mildly thickened and heterogeneous endometrial stripe measuring up to 23 mm.  No endometrial fluid. Unremarkable right ovary measures 3.3 x 2.7 x 1.8 cm.  Vascular flow demonstrated on power Doppler.  Unremarkable left ovary measures 2.5 x 2.5 x 1.7 cm.  Color flow identified by power Doppler.  Probable corpus luteum on the left.  No free fluid in the pelvis.  IMPRESSION:  No intrauterine gestational sac identified.  In the setting of a positive pregnancy test, differential considerations include very early intrauterine pregnancy, failed intrauterine pregnancy and ectopic pregnancy.  Recommend clinical correlation with serial beta HCG.   Original Report Authenticated By: Malachy Moan, M.D.   US Ob Transvaginal  04/20/2013   *RADIOLOGY REPORT*  Clinical Data: Vaginal bleeding, positive pregnancy test  OBSTETRIC <14 WK [place] AND TRANSVAGINAL OB US  Technique:  Both transabdominal and transvaginal ultrasound examinations were performed for complete evaluation of the gestation as well as the maternal uterus, adnexal regions, and pelvic cul-de-sac.  Transvaginal technique was performed to assess early pregnancy.  Comparison:  Prior obstetrical ultrasound 10/05/2012  Intrauterine gestational sac:  None visualized Yolk sac: None Embryo: None Cardiac Activity: None  Maternal uterus/adnexae: Unremarkable sonographic appearance of the uterus. Mildly thickened and heterogeneous endometrial stripe measuring up to 23 mm.  No endometrial fluid. Unremarkable right ovary  measures 3.3 x 2.7 x 1.8 cm.  Vascular flow demonstrated on power Doppler.  Unremarkable left ovary measures 2.5 x 2.5 x 1.7 cm.  Color flow identified by power Doppler.  Probable corpus luteum on the left.  No free fluid in the pelvis.  IMPRESSION:  No intrauterine gestational sac identified.  In the setting of a positive pregnancy test, differential considerations include very early intrauterine pregnancy, failed intrauterine pregnancy and ectopic pregnancy.  Recommend clinical correlation with serial beta HCG.   Original Report Authenticated By: Malachy Moan, M.D.   MAU course. Plan the patient that she likely experienced a miscarriage, but we cannot be sure of the pregnancy was not ectopic due to 2 not seen the patient was passing person or sending it to pathology. Intrauterine pregnancy not identified on ultrasound. Discussed importance of following up weekly at [place] for hCG levels.  Ibuprofen given for cramping. Bleeding stable.  ASSESSMENT: 1. Miscarriage     PLAN: Discharge status to condition. Support given. Bleeding and ectopic precautions. Pelvic rest until bleeding has ceased. Follow-up Information   Follow up with [facility]. (Weekly until hCG is less than 2 or as needed if symptoms worsen)    Specialty:  Obstetrics and Gynecology   Contact information:   [address] [place] [address] [phone]      Follow up with [facility] [place] [facility]. (As needed if symptoms worsen)    Contact information:   [address] [id number] [place] [address] [phone]        Medication List    STOP  taking these medications       alum & mag hydroxide-simeth 200-200-20 MG/5ML suspension  Commonly known as:  MAALOX/MYLANTA     calcium carbonate 500 MG chewable tablet  Commonly known as:  TUMS - dosed in mg elemental calcium     docusate sodium 100 MG capsule   Commonly known as:  COLACE     NIFEdipine 60 MG 24 hr tablet  Commonly known as:  PROCARDIA-XL/ADALAT CC     norethindrone 0.35 MG tablet  Commonly known as:  ORTHO MICRONOR     oxyCODONE-acetaminophen 5-325 MG per tablet  Commonly known as:  PERCOCET/ROXICET     prenatal multivitamin Tabs tablet      TAKE these medications       acetaminophen 500 MG tablet  Commonly known as:  TYLENOL  Take 1,000 mg by mouth every 6 (six) hours as needed for pain (back pain).     ibuprofen 600 MG tablet  Commonly known as:  ADVIL,MOTRIN  Take 1 tablet (600 mg total) by mouth once.        [place], CNM 04/20/2013 10:15 PM

## 2013-04-20 NOTE — Progress Notes (Signed)
Pt given Ibuprofen prior discharge

## 2013-04-20 NOTE — MAU Note (Signed)
Pt unsure if pregnant, but thinks she had miscarriage last night.  Passed large amount of ? Tissue.  Bleeding like normal period today, having severe lower back pain.

## 2013-04-21 LAB — URINE CULTURE
Colony Count: NO GROWTH
Culture: NO GROWTH

## 2014-02-23 ENCOUNTER — Encounter (HOSPITAL_COMMUNITY): Payer: Self-pay | Admitting: *Deleted

## 2014-04-26 DIAGNOSIS — Z98891 History of uterine scar from previous surgery: Secondary | ICD-10-CM | POA: Insufficient documentation

## 2014-04-26 DIAGNOSIS — Z8759 Personal history of other complications of pregnancy, childbirth and the puerperium: Secondary | ICD-10-CM | POA: Insufficient documentation

## 2014-04-26 DIAGNOSIS — O0990 Supervision of high risk pregnancy, unspecified, unspecified trimester: Secondary | ICD-10-CM | POA: Insufficient documentation

## 2014-04-26 DIAGNOSIS — O09529 Supervision of elderly multigravida, unspecified trimester: Secondary | ICD-10-CM | POA: Insufficient documentation

## 2014-04-26 LAB — OB RESULTS CONSOLE RUBELLA ANTIBODY, IGM: Rubella: IMMUNE

## 2014-04-26 LAB — OB RESULTS CONSOLE HIV ANTIBODY (ROUTINE TESTING): HIV: NONREACTIVE

## 2014-04-26 LAB — OB RESULTS CONSOLE ABO/RH: RH Type: POSITIVE

## 2014-04-26 LAB — OB RESULTS CONSOLE ANTIBODY SCREEN: Antibody Screen: NEGATIVE

## 2014-04-26 LAB — OB RESULTS CONSOLE HEPATITIS B SURFACE ANTIGEN: Hepatitis B Surface Ag: NEGATIVE

## 2014-04-26 LAB — OB RESULTS CONSOLE RPR: RPR: NONREACTIVE

## 2014-05-04 DIAGNOSIS — R7309 Other abnormal glucose: Secondary | ICD-10-CM | POA: Insufficient documentation

## 2014-05-04 LAB — US OB COMP LESS 14 WKS

## 2014-06-06 ENCOUNTER — Encounter (HOSPITAL_COMMUNITY): Payer: Self-pay | Admitting: *Deleted

## 2014-06-20 ENCOUNTER — Encounter: Payer: Self-pay | Admitting: Advanced Practice Midwife

## 2014-06-20 ENCOUNTER — Ambulatory Visit (INDEPENDENT_AMBULATORY_CARE_PROVIDER_SITE_OTHER): Payer: Medicaid Other | Admitting: Advanced Practice Midwife

## 2014-06-20 VITALS — BP 125/87 | HR 97 | Wt 225.0 lb

## 2014-06-20 DIAGNOSIS — O09299 Supervision of pregnancy with other poor reproductive or obstetric history, unspecified trimester: Secondary | ICD-10-CM | POA: Insufficient documentation

## 2014-06-20 DIAGNOSIS — O0992 Supervision of high risk pregnancy, unspecified, second trimester: Secondary | ICD-10-CM

## 2014-06-20 DIAGNOSIS — O09291 Supervision of pregnancy with other poor reproductive or obstetric history, first trimester: Secondary | ICD-10-CM

## 2014-06-20 DIAGNOSIS — Z8632 Personal history of gestational diabetes: Secondary | ICD-10-CM

## 2014-06-20 MED ORDER — GLYCOPYRROLATE 2 MG PO TABS: 1.0000 mg | ORAL_TABLET | Freq: Three times a day (TID) | ORAL | Status: DC | PRN

## 2014-06-20 MED ORDER — METFORMIN HCL 500 MG PO TABS: 500.0000 mg | ORAL_TABLET | Freq: Every day | ORAL | Status: DC

## 2014-06-20 NOTE — Patient Instructions (Signed)
Second Trimester of Pregnancy The second trimester is from week 13 through week 28, months 4 through 6. The second trimester is often a time when you feel your best. Your body has also adjusted to being pregnant, and you begin to feel better physically. Usually, morning sickness has lessened or quit completely, you may have more energy, and you may have an increase in appetite. The second trimester is also a time when the fetus is growing rapidly. At the end of the sixth month, the fetus is about 9 inches long and weighs about 1 pounds. You will likely begin to feel the baby move (quickening) between 18 and 20 weeks of the pregnancy. BODY CHANGES Your body goes through many changes during pregnancy. The changes vary from woman to woman.   Your weight will continue to increase. You will notice your lower abdomen bulging out.  You may begin to get stretch marks on your hips, abdomen, and breasts.  You may develop headaches that can be relieved by medicines approved by your health care provider.  You may urinate more often because the fetus is pressing on your bladder.  You may develop or continue to have heartburn as a result of your pregnancy.  You may develop constipation because certain hormones are causing the muscles that push waste through your intestines to slow down.  You may develop hemorrhoids or swollen, bulging veins (varicose veins).  You may have back pain because of the weight gain and pregnancy hormones relaxing your joints between the bones in your pelvis and as a result of a shift in weight and the muscles that support your balance.  Your breasts will continue to grow and be tender.  Your gums may bleed and may be sensitive to brushing and flossing.  Dark spots or blotches (chloasma, mask of pregnancy) may develop on your face. This will likely fade after the baby is born.  A dark line from your belly button to the pubic area (linea nigra) may appear. This will likely fade  after the baby is born.  You may have changes in your hair. These can include thickening of your hair, rapid growth, and changes in texture. Some women also have hair loss during or after pregnancy, or hair that feels dry or thin. Your hair will most likely return to normal after your baby is born. WHAT TO EXPECT AT YOUR PRENATAL VISITS During a routine prenatal visit:  You will be weighed to make sure you and the fetus are growing normally.  Your blood pressure will be taken.  Your abdomen will be measured to track your baby's growth.  The fetal heartbeat will be listened to.  Any test results from the previous visit will be discussed. Your health care provider may ask you:  How you are feeling.  If you are feeling the baby move.  If you have had any abnormal symptoms, such as leaking fluid, bleeding, severe headaches, or abdominal cramping.  If you have any questions. Other tests that may be performed during your second trimester include:  Blood tests that check for:  Low iron levels (anemia).  Gestational diabetes (between 24 and 28 weeks).  Rh antibodies.  Urine tests to check for infections, diabetes, or protein in the urine.  An ultrasound to confirm the proper growth and development of the baby.  An amniocentesis to check for possible genetic problems.  Fetal screens for spina bifida and Down syndrome. HOME CARE INSTRUCTIONS   Avoid all smoking, herbs, alcohol, and unprescribed   drugs. These chemicals affect the formation and growth of the baby.  Follow your health care provider's instructions regarding medicine use. There are medicines that are either safe or unsafe to take during pregnancy.  Exercise only as directed by your health care provider. Experiencing uterine cramps is a good sign to stop exercising.  Continue to eat regular, healthy meals.  Wear a good support bra for breast tenderness.  Do not use hot tubs, steam rooms, or saunas.  Wear your  seat belt at all times when driving.  Avoid raw meat, uncooked cheese, cat litter boxes, and soil used by cats. These carry germs that can cause birth defects in the baby.  Take your prenatal vitamins.  Try taking a stool softener (if your health care provider approves) if you develop constipation. Eat more high-fiber foods, such as fresh vegetables or fruit and whole grains. Drink plenty of fluids to keep your urine clear or pale yellow.  Take warm sitz baths to soothe any pain or discomfort caused by hemorrhoids. Use hemorrhoid cream if your health care provider approves.  If you develop varicose veins, wear support hose. Elevate your feet for 15 minutes, 3-4 times a day. Limit salt in your diet.  Avoid heavy lifting, wear low heel shoes, and practice good posture.  Rest with your legs elevated if you have leg cramps or low back pain.  Visit your dentist if you have not gone yet during your pregnancy. Use a soft toothbrush to brush your teeth and be gentle when you floss.  A sexual relationship may be continued unless your health care provider directs you otherwise.  Continue to go to all your prenatal visits as directed by your health care provider. SEEK MEDICAL CARE IF:   You have dizziness.  You have mild pelvic cramps, pelvic pressure, or nagging pain in the abdominal area.  You have persistent nausea, vomiting, or diarrhea.  You have a bad smelling vaginal discharge.  You have pain with urination. SEEK IMMEDIATE MEDICAL CARE IF:   You have a fever.  You are leaking fluid from your vagina.  You have spotting or bleeding from your vagina.  You have severe abdominal cramping or pain.  You have rapid weight gain or loss.  You have shortness of breath with chest pain.  You notice sudden or extreme swelling of your face, hands, ankles, feet, or legs.  You have not felt your baby move in over an hour.  You have severe headaches that do not go away with  medicine.  You have vision changes. Document Released: 07/16/2001 Document Revised: 07/27/2013 Document Reviewed: 09/22/2012 Exit[employer] Patient Information 2015 [employer]. This information is not intended to replace advice given to you by your health care provider. Make sure you discuss any questions you have with your health care provider.  

## 2014-06-20 NOTE — Progress Notes (Signed)
Having congestion headaches

## 2014-06-21 ENCOUNTER — Encounter: Payer: Self-pay | Admitting: Advanced Practice Midwife

## 2014-06-21 NOTE — Addendum Note (Signed)
Addended by: Asencion Islam on: 06/21/2014 04:10 PM   Modules accepted: Orders

## 2014-06-21 NOTE — Progress Notes (Signed)
New OB transfer from [facility]. See Smart Set. States has been checking her FBS and 2 hr PC glucose levels   Had A1C which was mildly elevated. Wants to continue with Diabetic diet and testing.  States fastings are mid 90s to low 100s. We discussed adding Metformin at HS.  OK per Dr Elly Modena. Pt states other practice won't use oral meds, only insulin. Wants to try oral first. Wants to do TOLAC.  Subjective:    Jenny Marshall is a [id number] [redacted]w[redacted]d being seen today for her first obstetrical visit.  Her obstetrical history is significant for large for gestational age, pregnancy induced hypertension and GDM, previous C/S. Patient does intend to breast feed. Pregnancy history fully reviewed.  Patient reports no complaints.  Filed Vitals:   06/20/14 1109  BP: 125/87  Pulse: 97  Weight: 225 lb (102.059 kg)    HISTORY: OB History  Gravida Para Term Preterm AB SAB TAB Ectopic Multiple Living  5 3 3       3     # Outcome Date GA Lbr Len/2nd Weight Sex Delivery Anes PTL Lv  5 Current           4 Term 10/05/12 [redacted]w[redacted]d  11 lb 6 oz (5.16 kg) M CS-LTranv Spinal  Y  3 Term 08/2007 [redacted]w[redacted]d 06:00 8 lb 13 oz (3.997 kg) M Vag-Spont None  Y     Comments: GDM; IOL  2 Term 01/2006 [redacted]w[redacted]d 06:00 8 lb 5 oz (3.771 kg) M Vag-Spont None  Y     Comments: SEVERE TEAR  1 Gravida              Comments: System Generated. Please review and update pregnancy details.    Obstetric Comments  States last baby turned breech at the last minute. They had suspected he was 10lb.   Past Medical History  Diagnosis Date  . Shortness of breath     AGE 80-21; WITH STRESS  . Irritable bowel     X SEVERAL YEARS;  RESOLVED 2006  . Varicose veins   . Anginal pain     AGE 80-21; HAD EVAL; NO DX; OCCURS WITH STRSS ONT BLOOD THINNERS AGE 83  . Anemia     CHRONIC  . H/O varicella   . Low iron   . Obesity   . Gestational diabetes 2014    current pregnancy   Past Surgical History  Procedure Laterality Date  . Vulva surgery  at age  38    clitorectomy  . Cesarean section N/A 10/05/2012    Procedure: Primary cesarean section with delivery of baby boy 61. Apgars 8/9.;  Surgeon: Alwyn Pea, MD;  Location: [facility];  Service: Obstetrics;  Laterality: N/A;   Family History  Problem Relation Age of Onset  . Hypertension Father   . Hyperlipidemia Paternal Aunt   . Diabetes Paternal Aunt   . Hypertension Paternal Aunt   . Kidney disease Paternal Aunt   . Hyperlipidemia Paternal Uncle   . Diabetes Paternal Uncle   . Hypertension Paternal Uncle   . Stroke Paternal Uncle   . Rheum arthritis Paternal Uncle   . Arthritis Maternal Grandmother   . Diabetes Maternal Grandmother   . Hyperlipidemia Maternal Grandmother   . Other Maternal Grandmother     VARICOSE VEINS  . Heart disease Maternal Grandfather   . Vision loss Paternal Grandfather     GLAUCOMA     Exam    Uterus:  Fundal Height: 14 cm  Pelvic  Exam:    Perineum: Deferred, done at other practice   Vulva: Deferred   Vagina:  Deferred   pH:    Cervix: Deferred   Adnexa: not evaluated   Bony Pelvis: gynecoid  System: Breast:  normal appearance, no masses or tenderness   Skin: normal coloration and turgor, no rashes    Neurologic: oriented, grossly non-focal   Extremities: normal strength, tone, and muscle mass   HEENT neck supple with midline trachea   Mouth/Teeth mucous membranes moist, pharynx normal without lesions   Neck supple   Cardiovascular: regular rate and rhythm   Respiratory:  appears well, vitals normal, no respiratory distress, acyanotic, normal RR, ear and throat exam is normal, neck free of mass or lymphadenopathy, chest clear, no wheezing, crepitations, rhonchi, normal symmetric air entry   Abdomen: soft, non-tender; bowel sounds normal; no masses,  no organomegaly   Urinary: n/a      Assessment:    Pregnancy: [id number] Patient Active Problem List   Diagnosis Date Noted  . Hx of preeclampsia, prior pregnancy, currently pregnant  06/20/2014  . H/O gestational diabetes in prior pregnancy, currently pregnant 06/20/2014  . Elevated glycated hemoglobin 05/04/2014  . Advanced maternal age in multigravida 04/26/2014  . H/O previous obstetrical problem 04/26/2014  . High-risk pregnancy 04/26/2014  . H/O cesarean section 04/26/2014        Plan:     Initial labs drawn. Prenatal vitamins. Problem list reviewed and updated. Genetic Screening discussed Integrated Screen and Quad Screen: declined.  Ultrasound discussed; fetal survey: ordered.  Follow up in 4 weeks. 50% of 30 min visit spent on counseling and coordination of care.   Long discussion of LGA and diabetes. Discussed with Dr Elly Modena. Will treat as Gestational Diabetes. Has meter and is testing. Discussed parameters. Will add Metformin 500mg  qhs, may need to increase to 1000mg .   Rx Robinul for spitting. Needs 24 hr baseline labs  [facility] 06/21/2014

## 2014-06-24 ENCOUNTER — Telehealth: Payer: Self-pay | Admitting: *Deleted

## 2014-06-24 DIAGNOSIS — O24419 Gestational diabetes mellitus in pregnancy, unspecified control: Secondary | ICD-10-CM

## 2014-06-24 MED ORDER — GLYBURIDE 2.5 MG PO TABS: 2.5000 mg | ORAL_TABLET | Freq: Two times a day (BID) | ORAL | Status: DC

## 2014-06-24 NOTE — Telephone Encounter (Signed)
Pt called stating that she could not tolerate the Metformin and wanted to switch to something else.  Spoke with Hansel Feinstein, CNM who d/c'd her Metformin and switched patient to Glyburide 2.5 mg.  Pt states that she is familiar with that in her last 2 pregnancies.

## 2014-07-18 ENCOUNTER — Encounter: Payer: Medicaid Other | Admitting: Advanced Practice Midwife

## 2014-07-20 ENCOUNTER — Ambulatory Visit (INDEPENDENT_AMBULATORY_CARE_PROVIDER_SITE_OTHER): Payer: Medicaid Other | Admitting: Obstetrics & Gynecology

## 2014-07-20 VITALS — BP 125/89 | HR 82 | Wt 233.0 lb

## 2014-07-20 DIAGNOSIS — O0992 Supervision of high risk pregnancy, unspecified, second trimester: Secondary | ICD-10-CM

## 2014-07-20 DIAGNOSIS — O09292 Supervision of pregnancy with other poor reproductive or obstetric history, second trimester: Secondary | ICD-10-CM

## 2014-07-20 DIAGNOSIS — O24912 Unspecified diabetes mellitus in pregnancy, second trimester: Secondary | ICD-10-CM

## 2014-07-20 NOTE — Progress Notes (Signed)
All CBGs nml.  Continue testing daily fasting and alternate breakfast, lunch, and dinner pp.  If become elevated, check after all meals.  Discussed baby asa for preeclampsia prevention.  Pt AMA and type 2 DM.   Will need fetal echo and baseline labs.  Anatomy scan is at the end of the month.  Has not turned in yet.   Patient aware there are males in our group and she is fine with them taking care of her.

## 2014-08-02 ENCOUNTER — Ambulatory Visit (HOSPITAL_COMMUNITY)
Admission: RE | Admit: 2014-08-02 | Discharge: 2014-08-02 | Disposition: A | Discharge status: Home / Self Care | Payer: Medicaid Other | Source: Ambulatory Visit | Attending: Advanced Practice Midwife | Admitting: Advanced Practice Midwife

## 2014-08-02 DIAGNOSIS — O3421 Maternal care for scar from previous cesarean delivery: Secondary | ICD-10-CM | POA: Diagnosis present

## 2014-08-02 DIAGNOSIS — O09522 Supervision of elderly multigravida, second trimester: Secondary | ICD-10-CM | POA: Diagnosis not present

## 2014-08-02 DIAGNOSIS — Z3A20 20 weeks gestation of pregnancy: Secondary | ICD-10-CM | POA: Diagnosis not present

## 2014-08-02 DIAGNOSIS — O09529 Supervision of elderly multigravida, unspecified trimester: Secondary | ICD-10-CM | POA: Insufficient documentation

## 2014-08-02 DIAGNOSIS — O09291 Supervision of pregnancy with other poor reproductive or obstetric history, first trimester: Secondary | ICD-10-CM

## 2014-08-02 DIAGNOSIS — Z36 Encounter for antenatal screening of mother: Secondary | ICD-10-CM | POA: Insufficient documentation

## 2014-08-02 DIAGNOSIS — O24419 Gestational diabetes mellitus in pregnancy, unspecified control: Secondary | ICD-10-CM | POA: Diagnosis not present

## 2014-08-02 DIAGNOSIS — O09292 Supervision of pregnancy with other poor reproductive or obstetric history, second trimester: Secondary | ICD-10-CM | POA: Insufficient documentation

## 2014-08-02 DIAGNOSIS — Z3689 Encounter for other specified antenatal screening: Secondary | ICD-10-CM | POA: Insufficient documentation

## 2014-08-05 NOTE — L&D Delivery Note (Signed)
Delivery Note Called to evaluate patient with possible face presentation. Face presentation with mentum anterior confirmed on exam.  Patient fully dilated and pushing.  At 1:07 PM a viable female was delivered via VBAC, Spontaneous (mentum anterior  ).  APGAR: 8, 9; weight 10 lb 0.1 oz (4540 g).   Placenta status: Intact, Spontaneous.  Cord: 3 vessels with the following complications: None.    Anesthesia: Local  Episiotomy: None Lacerations: 2nd degree;Vaginal Suture Repair: 2.0 vicryl Est. Blood Loss (mL): 850  Mom to postpartum.  Baby to Couplet care / Skin to Skin.  Judith Campillo 12/07/2014, 3:17 PM

## 2014-08-10 ENCOUNTER — Encounter: Payer: Medicaid Other | Admitting: Obstetrics & Gynecology

## 2014-08-12 ENCOUNTER — Ambulatory Visit (INDEPENDENT_AMBULATORY_CARE_PROVIDER_SITE_OTHER): Payer: Medicaid Other | Admitting: Advanced Practice Midwife

## 2014-08-12 ENCOUNTER — Encounter: Payer: Self-pay | Admitting: Advanced Practice Midwife

## 2014-08-12 VITALS — BP 109/74 | HR 91 | Wt 232.0 lb

## 2014-08-12 DIAGNOSIS — O24414 Gestational diabetes mellitus in pregnancy, insulin controlled: Secondary | ICD-10-CM

## 2014-08-12 DIAGNOSIS — O0992 Supervision of high risk pregnancy, unspecified, second trimester: Secondary | ICD-10-CM

## 2014-08-12 NOTE — Progress Notes (Signed)
Increased Glyburide to 5mg  qHS herself    CBGs now:  FBS 60-85, 2 hr B= 94-99, 2 hr Lunch = 105-109, Dinner = 100-107  Anatomy US normal. They recommend serial [place] in third trimester.

## 2014-08-12 NOTE — Patient Instructions (Signed)
Second Trimester of Pregnancy The second trimester is from week 13 through week 28, months 4 through 6. The second trimester is often a time when you feel your best. Your body has also adjusted to being pregnant, and you begin to feel better physically. Usually, morning sickness has lessened or quit completely, you may have more energy, and you may have an increase in appetite. The second trimester is also a time when the fetus is growing rapidly. At the end of the sixth month, the fetus is about 9 inches long and weighs about 1 pounds. You will likely begin to feel the baby move (quickening) between 18 and 20 weeks of the pregnancy. BODY CHANGES Your body goes through many changes during pregnancy. The changes vary from woman to woman.   Your weight will continue to increase. You will notice your lower abdomen bulging out.  You may begin to get stretch marks on your hips, abdomen, and breasts.  You may develop headaches that can be relieved by medicines approved by your health care provider.  You may urinate more often because the fetus is pressing on your bladder.  You may develop or continue to have heartburn as a result of your pregnancy.  You may develop constipation because certain hormones are causing the muscles that push waste through your intestines to slow down.  You may develop hemorrhoids or swollen, bulging veins (varicose veins).  You may have back pain because of the weight gain and pregnancy hormones relaxing your joints between the bones in your pelvis and as a result of a shift in weight and the muscles that support your balance.  Your breasts will continue to grow and be tender.  Your gums may bleed and may be sensitive to brushing and flossing.  Dark spots or blotches (chloasma, mask of pregnancy) may develop on your face. This will likely fade after the baby is born.  A dark line from your belly button to the pubic area (linea nigra) may appear. This will likely fade  after the baby is born.  You may have changes in your hair. These can include thickening of your hair, rapid growth, and changes in texture. Some women also have hair loss during or after pregnancy, or hair that feels dry or thin. Your hair will most likely return to normal after your baby is born. WHAT TO EXPECT AT YOUR PRENATAL VISITS During a routine prenatal visit:  You will be weighed to make sure you and the fetus are growing normally.  Your blood pressure will be taken.  Your abdomen will be measured to track your baby's growth.  The fetal heartbeat will be listened to.  Any test results from the previous visit will be discussed. Your health care provider may ask you:  How you are feeling.  If you are feeling the baby move.  If you have had any abnormal symptoms, such as leaking fluid, bleeding, severe headaches, or abdominal cramping.  If you have any questions. Other tests that may be performed during your second trimester include:  Blood tests that check for:  Low iron levels (anemia).  Gestational diabetes (between 24 and 28 weeks).  Rh antibodies.  Urine tests to check for infections, diabetes, or protein in the urine.  An ultrasound to confirm the proper growth and development of the baby.  An amniocentesis to check for possible genetic problems.  Fetal screens for spina bifida and Down syndrome. HOME CARE INSTRUCTIONS   Avoid all smoking, herbs, alcohol, and unprescribed   drugs. These chemicals affect the formation and growth of the baby.  Follow your health care provider's instructions regarding medicine use. There are medicines that are either safe or unsafe to take during pregnancy.  Exercise only as directed by your health care provider. Experiencing uterine cramps is a good sign to stop exercising.  Continue to eat regular, healthy meals.  Wear a good support bra for breast tenderness.  Do not use hot tubs, steam rooms, or saunas.  Wear your  seat belt at all times when driving.  Avoid raw meat, uncooked cheese, cat litter boxes, and soil used by cats. These carry germs that can cause birth defects in the baby.  Take your prenatal vitamins.  Try taking a stool softener (if your health care provider approves) if you develop constipation. Eat more high-fiber foods, such as fresh vegetables or fruit and whole grains. Drink plenty of fluids to keep your urine clear or pale yellow.  Take warm sitz baths to soothe any pain or discomfort caused by hemorrhoids. Use hemorrhoid cream if your health care provider approves.  If you develop varicose veins, wear support hose. Elevate your feet for 15 minutes, 3-4 times a day. Limit salt in your diet.  Avoid heavy lifting, wear low heel shoes, and practice good posture.  Rest with your legs elevated if you have leg cramps or low back pain.  Visit your dentist if you have not gone yet during your pregnancy. Use a soft toothbrush to brush your teeth and be gentle when you floss.  A sexual relationship may be continued unless your health care provider directs you otherwise.  Continue to go to all your prenatal visits as directed by your health care provider. SEEK MEDICAL CARE IF:   You have dizziness.  You have mild pelvic cramps, pelvic pressure, or nagging pain in the abdominal area.  You have persistent nausea, vomiting, or diarrhea.  You have a bad smelling vaginal discharge.  You have pain with urination. SEEK IMMEDIATE MEDICAL CARE IF:   You have a fever.  You are leaking fluid from your vagina.  You have spotting or bleeding from your vagina.  You have severe abdominal cramping or pain.  You have rapid weight gain or loss.  You have shortness of breath with chest pain.  You notice sudden or extreme swelling of your face, hands, ankles, feet, or legs.  You have not felt your baby move in over an hour.  You have severe headaches that do not go away with  medicine.  You have vision changes. Document Released: 07/16/2001 Document Revised: 07/27/2013 Document Reviewed: 09/22/2012 Exit[employer] Patient Information 2015 [employer]. This information is not intended to replace advice given to you by your health care provider. Make sure you discuss any questions you have with your health care provider.  

## 2014-08-15 ENCOUNTER — Encounter: Payer: Self-pay | Admitting: *Deleted

## 2014-09-06 ENCOUNTER — Telehealth: Payer: Self-pay | Admitting: *Deleted

## 2014-09-06 NOTE — Telephone Encounter (Signed)
Pt called in stating she feels dizzy. Pt states she feels the baby move as normal, no contractions, no bleeding. Pt states CBG is WNL. Adv pt to stay well hydrated and check BP and if Sx persist she can be seen in [facility].

## 2014-09-12 ENCOUNTER — Encounter: Payer: Self-pay | Admitting: *Deleted

## 2014-09-12 ENCOUNTER — Ambulatory Visit (INDEPENDENT_AMBULATORY_CARE_PROVIDER_SITE_OTHER): Payer: Medicaid Other | Admitting: Advanced Practice Midwife

## 2014-09-12 VITALS — BP 126/80 | HR 89 | Wt 240.0 lb

## 2014-09-12 DIAGNOSIS — Z3482 Encounter for supervision of other normal pregnancy, second trimester: Secondary | ICD-10-CM

## 2014-09-12 DIAGNOSIS — O99019 Anemia complicating pregnancy, unspecified trimester: Secondary | ICD-10-CM

## 2014-09-12 DIAGNOSIS — Z3A27 27 weeks gestation of pregnancy: Secondary | ICD-10-CM

## 2014-09-12 DIAGNOSIS — O99012 Anemia complicating pregnancy, second trimester: Secondary | ICD-10-CM

## 2014-09-12 LAB — CBC
HEMATOCRIT: 32.1 % — AB (ref 36.0–46.0)
HEMOGLOBIN: 11 g/dL — AB (ref 12.0–15.0)
MCH: 30.1 pg (ref 26.0–34.0)
MCHC: 34.3 g/dL (ref 30.0–36.0)
MCV: 87.7 fL (ref 78.0–100.0)
MPV: 11.8 fL (ref 8.6–12.4)
PLATELETS: 221 10*3/uL (ref 150–400)
RBC: 3.66 MIL/uL — ABNORMAL LOW (ref 3.87–5.11)
RDW: 14.7 % (ref 11.5–15.5)
WBC: 7.6 10*3/uL (ref 4.0–10.5)

## 2014-09-12 NOTE — Progress Notes (Signed)
Pt states she has been SOB and fatigued - wants to check hemoglobin today

## 2014-09-13 ENCOUNTER — Telehealth: Payer: Self-pay | Admitting: *Deleted

## 2014-09-13 NOTE — Progress Notes (Signed)
States Fasting sugars are all below 80 (mostly 70s).  2 hour PCs are around 110s.  Has been tired lately and wants Hemoglobin checked. Has not been taking her prenatal vitamin, so has restarted it. Discussed our cutoff for anemia in pregnancy is Hgb=11

## 2014-09-13 NOTE — Patient Instructions (Signed)
Second Trimester of Pregnancy The second trimester is from week 13 through week 28, months 4 through 6. The second trimester is often a time when you feel your best. Your body has also adjusted to being pregnant, and you begin to feel better physically. Usually, morning sickness has lessened or quit completely, you may have more energy, and you may have an increase in appetite. The second trimester is also a time when the fetus is growing rapidly. At the end of the sixth month, the fetus is about 9 inches long and weighs about 1 pounds. You will likely begin to feel the baby move (quickening) between 18 and 20 weeks of the pregnancy. BODY CHANGES Your body goes through many changes during pregnancy. The changes vary from woman to woman.   Your weight will continue to increase. You will notice your lower abdomen bulging out.  You may begin to get stretch marks on your hips, abdomen, and breasts.  You may develop headaches that can be relieved by medicines approved by your health care provider.  You may urinate more often because the fetus is pressing on your bladder.  You may develop or continue to have heartburn as a result of your pregnancy.  You may develop constipation because certain hormones are causing the muscles that push waste through your intestines to slow down.  You may develop hemorrhoids or swollen, bulging veins (varicose veins).  You may have back pain because of the weight gain and pregnancy hormones relaxing your joints between the bones in your pelvis and as a result of a shift in weight and the muscles that support your balance.  Your breasts will continue to grow and be tender.  Your gums may bleed and may be sensitive to brushing and flossing.  Dark spots or blotches (chloasma, mask of pregnancy) may develop on your face. This will likely fade after the baby is born.  A dark line from your belly button to the pubic area (linea nigra) may appear. This will likely fade  after the baby is born.  You may have changes in your hair. These can include thickening of your hair, rapid growth, and changes in texture. Some women also have hair loss during or after pregnancy, or hair that feels dry or thin. Your hair will most likely return to normal after your baby is born. WHAT TO EXPECT AT YOUR PRENATAL VISITS During a routine prenatal visit:  You will be weighed to make sure you and the fetus are growing normally.  Your blood pressure will be taken.  Your abdomen will be measured to track your baby's growth.  The fetal heartbeat will be listened to.  Any test results from the previous visit will be discussed. Your health care provider may ask you:  How you are feeling.  If you are feeling the baby move.  If you have had any abnormal symptoms, such as leaking fluid, bleeding, severe headaches, or abdominal cramping.  If you have any questions. Other tests that may be performed during your second trimester include:  Blood tests that check for:  Low iron levels (anemia).  Gestational diabetes (between 24 and 28 weeks).  Rh antibodies.  Urine tests to check for infections, diabetes, or protein in the urine.  An ultrasound to confirm the proper growth and development of the baby.  An amniocentesis to check for possible genetic problems.  Fetal screens for spina bifida and Down syndrome. HOME CARE INSTRUCTIONS   Avoid all smoking, herbs, alcohol, and unprescribed   drugs. These chemicals affect the formation and growth of the baby.  Follow your health care provider's instructions regarding medicine use. There are medicines that are either safe or unsafe to take during pregnancy.  Exercise only as directed by your health care provider. Experiencing uterine cramps is a good sign to stop exercising.  Continue to eat regular, healthy meals.  Wear a good support bra for breast tenderness.  Do not use hot tubs, steam rooms, or saunas.  Wear your  seat belt at all times when driving.  Avoid raw meat, uncooked cheese, cat litter boxes, and soil used by cats. These carry germs that can cause birth defects in the baby.  Take your prenatal vitamins.  Try taking a stool softener (if your health care provider approves) if you develop constipation. Eat more high-fiber foods, such as fresh vegetables or fruit and whole grains. Drink plenty of fluids to keep your urine clear or pale yellow.  Take warm sitz baths to soothe any pain or discomfort caused by hemorrhoids. Use hemorrhoid cream if your health care provider approves.  If you develop varicose veins, wear support hose. Elevate your feet for 15 minutes, 3-4 times a day. Limit salt in your diet.  Avoid heavy lifting, wear low heel shoes, and practice good posture.  Rest with your legs elevated if you have leg cramps or low back pain.  Visit your dentist if you have not gone yet during your pregnancy. Use a soft toothbrush to brush your teeth and be gentle when you floss.  A sexual relationship may be continued unless your health care provider directs you otherwise.  Continue to go to all your prenatal visits as directed by your health care provider. SEEK MEDICAL CARE IF:   You have dizziness.  You have mild pelvic cramps, pelvic pressure, or nagging pain in the abdominal area.  You have persistent nausea, vomiting, or diarrhea.  You have a bad smelling vaginal discharge.  You have pain with urination. SEEK IMMEDIATE MEDICAL CARE IF:   You have a fever.  You are leaking fluid from your vagina.  You have spotting or bleeding from your vagina.  You have severe abdominal cramping or pain.  You have rapid weight gain or loss.  You have shortness of breath with chest pain.  You notice sudden or extreme swelling of your face, hands, ankles, feet, or legs.  You have not felt your baby move in over an hour.  You have severe headaches that do not go away with  medicine.  You have vision changes. Document Released: 07/16/2001 Document Revised: 07/27/2013 Document Reviewed: 09/22/2012 Exit[employer] Patient Information 2015 [employer]. This information is not intended to replace advice given to you by your health care provider. Make sure you discuss any questions you have with your health care provider.  

## 2014-09-13 NOTE — Telephone Encounter (Signed)
Pt notified of Hgb results.  She is currently taking her PNV

## 2014-10-10 ENCOUNTER — Ambulatory Visit (INDEPENDENT_AMBULATORY_CARE_PROVIDER_SITE_OTHER): Payer: Medicaid Other | Admitting: Advanced Practice Midwife

## 2014-10-10 VITALS — BP 118/76 | HR 99 | Wt 245.0 lb

## 2014-10-10 DIAGNOSIS — O0993 Supervision of high risk pregnancy, unspecified, third trimester: Secondary | ICD-10-CM

## 2014-10-10 DIAGNOSIS — O24419 Gestational diabetes mellitus in pregnancy, unspecified control: Secondary | ICD-10-CM

## 2014-10-10 DIAGNOSIS — R7309 Other abnormal glucose: Secondary | ICD-10-CM

## 2014-10-10 MED ORDER — GLYBURIDE 2.5 MG PO TABS: 2.5000 mg | ORAL_TABLET | Freq: Every day | ORAL | Status: DC

## 2014-10-10 NOTE — Progress Notes (Signed)
Fasting 76, 2 hour PC's <110's. Taking Glyburide 2.5 mg QHS. Growth [place] ordered. Start antenatal testing at 32 weeks (NV).

## 2014-10-10 NOTE — Patient Instructions (Signed)
Vaginal Birth After Cesarean Delivery Vaginal birth after cesarean delivery (VBAC) is giving birth vaginally after previously delivering a baby by a cesarean. In the past, if a woman had a cesarean delivery, all births afterward would be done by cesarean delivery. This is no longer true. It can be safe for the mother to try a vaginal delivery after having a cesarean delivery.  It is important to discuss VBAC with your health care provider early in the pregnancy so you can understand the risks, benefits, and options. It will give you time to decide what is best in your particular case. The final decision about whether to have a VBAC or repeat cesarean delivery should be between you and your health care provider. Any changes in your health or your baby's health during your pregnancy may make it necessary to change your initial decision about VBAC.  WOMEN WHO PLAN TO HAVE A VBAC SHOULD CHECK WITH THEIR HEALTH CARE PROVIDER TO BE SURE THAT:  The previous cesarean delivery was done with a low transverse uterine cut (incision) (not a vertical classical incision).   The birth canal is big enough for the baby.   There were no other operations on the uterus.   An electronic fetal monitor (EFM) will be on at all times during labor.   An operating room will be available and ready in case an emergency cesarean delivery is needed.   A health care provider and surgical nursing staff will be available at all times during labor to be ready to do an emergency delivery cesarean if necessary.   An anesthesiologist will be present in case an emergency cesarean delivery is needed.   The nursery is prepared and has adequate personnel and necessary equipment available to care for the baby in case of an emergency cesarean delivery. BENEFITS OF VBAC  Shorter stay in the hospital.   Avoidance of risks associated with cesarean delivery, such as:  Surgical complications, such as opening of the incision or  hernia in the incision.  Injury to other organs.  Fever. This can occur if an infection develops after surgery. It can also occur as a reaction to the medicine given to make you numb during the surgery.  Less blood loss and need for blood transfusions.  Lower risk of blood clots and infection.  Shorter recovery.   Decreased risk for having to remove the uterus (hysterectomy).   Decreased risk for the placenta to completely or partially cover the opening of the uterus (placenta previa) with a future pregnancy.   Decrease risk in future labor and delivery. RISKS OF A VBAC  Tearing (rupture) of the uterus. This is occurs in less than 1% of VBACs. The risk of this happening is higher if:  Steps are taken to begin the labor process (induce labor) or stimulate or strengthen contractions (augment labor).   Medicine is used to soften (ripen) the cervix.  Having to remove the uterus (hysterectomy) if it ruptures. VBAC SHOULD NOT BE DONE IF:  The previous cesarean delivery was done with a vertical (classical) or T-shaped incision or you do not know what kind of incision was made.   You had a ruptured uterus.   You have had certain types of surgery on your uterus, such as removal of uterine fibroids. Ask your health care provider about other types of surgeries that prevent you from having a VBAC.  You have certain medical or childbirth (obstetrical) problems.   There are problems with the baby.   You  have had two previous cesarean deliveries and no vaginal deliveries. OTHER FACTS TO KNOW ABOUT VBAC:  It is safe to have an epidural anesthetic with VBAC.   It is safe to turn the baby from a breech position (attempt an external cephalic version).   It is safe to try a VBAC with twins.   VBAC may not be successful if your baby weights 8.8 lb (4 kg) or more. However, weight predictions are not always accurate and should not be used alone to decide if VBAC is right for  you.  There is an increased failure rate if the time between the cesarean delivery and VBAC is less than 19 months.   Your health care provider may advise against a VBAC if you have preeclampsia (high blood pressure, protein in the urine, and swelling of face and extremities).   VBAC is often successful if you previously gave birth vaginally.   VBAC is often successful when the labor starts spontaneously before the due date.   Delivering a baby through a VBAC is similar to having a normal spontaneous vaginal delivery. Document Released: 01/12/2007 Document Revised: 12/06/2013 Document Reviewed: 02/18/2013 [facility] Patient Information 2015 [place], [place]. This information is not intended to replace advice given to you by your health care provider. Make sure you discuss any questions you have with your health care provider.

## 2014-10-17 ENCOUNTER — Ambulatory Visit (HOSPITAL_COMMUNITY)
Admission: RE | Admit: 2014-10-17 | Discharge: 2014-10-17 | Disposition: A | Discharge status: Home / Self Care | Payer: Medicaid Other | Source: Ambulatory Visit | Attending: Advanced Practice Midwife | Admitting: Advanced Practice Midwife

## 2014-10-17 DIAGNOSIS — Z3A31 31 weeks gestation of pregnancy: Secondary | ICD-10-CM | POA: Diagnosis not present

## 2014-10-17 DIAGNOSIS — O09293 Supervision of pregnancy with other poor reproductive or obstetric history, third trimester: Secondary | ICD-10-CM | POA: Diagnosis not present

## 2014-10-17 DIAGNOSIS — O3421 Maternal care for scar from previous cesarean delivery: Secondary | ICD-10-CM | POA: Diagnosis not present

## 2014-10-17 DIAGNOSIS — O24419 Gestational diabetes mellitus in pregnancy, unspecified control: Secondary | ICD-10-CM | POA: Insufficient documentation

## 2014-10-17 DIAGNOSIS — O09523 Supervision of elderly multigravida, third trimester: Secondary | ICD-10-CM | POA: Diagnosis not present

## 2014-10-17 DIAGNOSIS — Z79899 Other long term (current) drug therapy: Secondary | ICD-10-CM | POA: Insufficient documentation

## 2014-10-17 DIAGNOSIS — O99213 Obesity complicating pregnancy, third trimester: Secondary | ICD-10-CM | POA: Insufficient documentation

## 2014-10-19 ENCOUNTER — Ambulatory Visit (INDEPENDENT_AMBULATORY_CARE_PROVIDER_SITE_OTHER): Payer: Medicaid Other | Admitting: Obstetrics & Gynecology

## 2014-10-19 VITALS — BP 114/75 | HR 88

## 2014-10-19 DIAGNOSIS — O24414 Gestational diabetes mellitus in pregnancy, insulin controlled: Secondary | ICD-10-CM

## 2014-10-19 DIAGNOSIS — O0993 Supervision of high risk pregnancy, unspecified, third trimester: Secondary | ICD-10-CM

## 2014-10-19 DIAGNOSIS — Z23 Encounter for immunization: Secondary | ICD-10-CM

## 2014-10-19 MED ORDER — TETANUS-DIPHTH-ACELL PERTUSSIS 5-2.5-18.5 LF-MCG/0.5 IM SUSP
0.5000 mL | Freq: Once | INTRAMUSCULAR | Status: AC
  Administered 2014-10-19: 0.5 mL via INTRAMUSCULAR

## 2014-10-19 NOTE — Progress Notes (Signed)
Routine visit. Good FM. No problems. 90% growth on u/s 10-17-14. NST reviewed and reactive today. She is aware of the rec for baby asa, but declines. She reports excellent sugars, fastings all around 70 and 2 hour PCs less than 110.

## 2014-10-19 NOTE — Addendum Note (Signed)
Addended by: Asencion Islam on: 10/19/2014 10:49 AM   Modules accepted: Orders

## 2014-10-20 ENCOUNTER — Encounter: Payer: Self-pay | Admitting: Advanced Practice Midwife

## 2014-10-20 DIAGNOSIS — IMO0002 Reserved for concepts with insufficient information to code with codable children: Secondary | ICD-10-CM | POA: Insufficient documentation

## 2014-10-24 ENCOUNTER — Other Ambulatory Visit: Payer: Medicaid Other

## 2014-10-27 ENCOUNTER — Other Ambulatory Visit: Payer: Medicaid Other

## 2014-10-31 ENCOUNTER — Ambulatory Visit (INDEPENDENT_AMBULATORY_CARE_PROVIDER_SITE_OTHER): Payer: Medicaid Other | Admitting: Advanced Practice Midwife

## 2014-10-31 VITALS — BP 129/83 | HR 93 | Wt 250.0 lb

## 2014-10-31 DIAGNOSIS — O24419 Gestational diabetes mellitus in pregnancy, unspecified control: Secondary | ICD-10-CM

## 2014-10-31 DIAGNOSIS — Z3483 Encounter for supervision of other normal pregnancy, third trimester: Secondary | ICD-10-CM

## 2014-10-31 NOTE — Progress Notes (Signed)
Doing well.  Good fetal movement, denies vaginal bleeding, LOF, regular contractions.  NST-R.  Reviewed glucose log.  Pt has fastings all 70s-90s, but PP after breakfast mostly over 130, some 150s and 160s.  PP after lunch and dinner are 80s-100s, none out of range.  Pt tried taking additional  1/2 tablet of Glyburide 2.5mg  with breakfast but then had PP after lunch that were too low and did not feel well.  Suggest she take extra 1/2 tablet with fasting blood sugar, take 1-2 hours BEFORE breakfast if possible, to see if can decrease PP after breakfast without such a strong effect on lunch.  Will reevaluate at pt NST visit this week.  Still size> dates by 4 weeks, improved from 6 weeks ahead at previous visit.  Desires TOLAC.

## 2014-11-01 ENCOUNTER — Other Ambulatory Visit: Payer: Self-pay | Admitting: Advanced Practice Midwife

## 2014-11-03 ENCOUNTER — Ambulatory Visit (INDEPENDENT_AMBULATORY_CARE_PROVIDER_SITE_OTHER): Payer: Medicaid Other | Admitting: *Deleted

## 2014-11-03 VITALS — BP 118/66 | HR 92 | Wt 250.0 lb

## 2014-11-03 DIAGNOSIS — Z3A35 35 weeks gestation of pregnancy: Secondary | ICD-10-CM

## 2014-11-03 DIAGNOSIS — O0993 Supervision of high risk pregnancy, unspecified, third trimester: Secondary | ICD-10-CM

## 2014-11-03 DIAGNOSIS — O3663X0 Maternal care for excessive fetal growth, third trimester, not applicable or unspecified: Secondary | ICD-10-CM

## 2014-11-03 DIAGNOSIS — O24414 Gestational diabetes mellitus in pregnancy, insulin controlled: Secondary | ICD-10-CM

## 2014-11-03 NOTE — Progress Notes (Signed)
Warts on bottom of Rt foot.

## 2014-11-07 ENCOUNTER — Encounter: Payer: Self-pay | Admitting: Advanced Practice Midwife

## 2014-11-07 ENCOUNTER — Ambulatory Visit (INDEPENDENT_AMBULATORY_CARE_PROVIDER_SITE_OTHER): Payer: Medicaid Other | Admitting: Advanced Practice Midwife

## 2014-11-07 VITALS — BP 113/74 | HR 95 | Wt 252.0 lb

## 2014-11-07 DIAGNOSIS — O0993 Supervision of high risk pregnancy, unspecified, third trimester: Secondary | ICD-10-CM

## 2014-11-07 DIAGNOSIS — O24414 Gestational diabetes mellitus in pregnancy, insulin controlled: Secondary | ICD-10-CM

## 2014-11-07 NOTE — Progress Notes (Signed)
Doing well. Fastings all under 90. Two hr pp glucoses all normal now, with glyburide adjustment in am. Lots of FM. Feels oblique today. NST reactive

## 2014-11-07 NOTE — Patient Instructions (Signed)
Third Trimester of Pregnancy The third trimester is from week 29 through week 42, months 7 through 9. The third trimester is a time when the fetus is growing rapidly. At the end of the ninth month, the fetus is about 20 inches in length and weighs 6-10 pounds.  BODY CHANGES Your body goes through many changes during pregnancy. The changes vary from woman to woman.   Your weight will continue to increase. You can expect to gain 25-35 pounds (11-16 kg) by the end of the pregnancy.  You may begin to get stretch marks on your hips, abdomen, and breasts.  You may urinate more often because the fetus is moving lower into your pelvis and pressing on your bladder.  You may develop or continue to have heartburn as a result of your pregnancy.  You may develop constipation because certain hormones are causing the muscles that push waste through your intestines to slow down.  You may develop hemorrhoids or swollen, bulging veins (varicose veins).  You may have pelvic pain because of the weight gain and pregnancy hormones relaxing your joints between the bones in your pelvis. Backaches may result from overexertion of the muscles supporting your posture.  You may have changes in your hair. These can include thickening of your hair, rapid growth, and changes in texture. Some women also have hair loss during or after pregnancy, or hair that feels dry or thin. Your hair will most likely return to normal after your baby is born.  Your breasts will continue to grow and be tender. A yellow discharge may leak from your breasts called colostrum.  Your belly button may stick out.  You may feel short of breath because of your expanding uterus.  You may notice the fetus "dropping," or moving lower in your abdomen.  You may have a bloody mucus discharge. This usually occurs a few days to a week before labor begins.  Your cervix becomes thin and soft (effaced) near your due date. WHAT TO EXPECT AT YOUR PRENATAL  EXAMS  You will have prenatal exams every 2 weeks until week 36. Then, you will have weekly prenatal exams. During a routine prenatal visit:  You will be weighed to make sure you and the fetus are growing normally.  Your blood pressure is taken.  Your abdomen will be measured to track your baby's growth.  The fetal heartbeat will be listened to.  Any test results from the previous visit will be discussed.  You may have a cervical check near your due date to see if you have effaced. At around 36 weeks, your caregiver will check your cervix. At the same time, your caregiver will also perform a test on the secretions of the vaginal tissue. This test is to determine if a type of bacteria, Group B streptococcus, is present. Your caregiver will explain this further. Your caregiver may ask you:  What your birth plan is.  How you are feeling.  If you are feeling the baby move.  If you have had any abnormal symptoms, such as leaking fluid, bleeding, severe headaches, or abdominal cramping.  If you have any questions. Other tests or screenings that may be performed during your third trimester include:  Blood tests that check for low iron levels (anemia).  Fetal testing to check the health, activity level, and growth of the fetus. Testing is done if you have certain medical conditions or if there are problems during the pregnancy. FALSE LABOR You may feel small, irregular contractions that   eventually go away. These are called Braxton Hicks contractions, or false labor. Contractions may last for hours, days, or even weeks before true labor sets in. If contractions come at regular intervals, intensify, or become painful, it is best to be seen by your caregiver.  SIGNS OF LABOR   Menstrual-like cramps.  Contractions that are 5 minutes apart or less.  Contractions that start on the top of the uterus and spread down to the lower abdomen and back.  A sense of increased pelvic pressure or back  pain.  A watery or bloody mucus discharge that comes from the vagina. If you have any of these signs before the 37th week of pregnancy, call your caregiver right away. You need to go to the hospital to get checked immediately. HOME CARE INSTRUCTIONS   Avoid all smoking, herbs, alcohol, and unprescribed drugs. These chemicals affect the formation and growth of the baby.  Follow your caregiver's instructions regarding medicine use. There are medicines that are either safe or unsafe to take during pregnancy.  Exercise only as directed by your caregiver. Experiencing uterine cramps is a good sign to stop exercising.  Continue to eat regular, healthy meals.  Wear a good support bra for breast tenderness.  Do not use hot tubs, steam rooms, or saunas.  Wear your seat belt at all times when driving.  Avoid raw meat, uncooked cheese, cat litter boxes, and soil used by cats. These carry germs that can cause birth defects in the baby.  Take your prenatal vitamins.  Try taking a stool softener (if your caregiver approves) if you develop constipation. Eat more high-fiber foods, such as fresh vegetables or fruit and whole grains. Drink plenty of fluids to keep your urine clear or pale yellow.  Take warm sitz baths to soothe any pain or discomfort caused by hemorrhoids. Use hemorrhoid cream if your caregiver approves.  If you develop varicose veins, wear support hose. Elevate your feet for 15 minutes, 3-4 times a day. Limit salt in your diet.  Avoid heavy lifting, wear low heal shoes, and practice good posture.  Rest a lot with your legs elevated if you have leg cramps or low back pain.  Visit your dentist if you have not gone during your pregnancy. Use a soft toothbrush to brush your teeth and be gentle when you floss.  A sexual relationship may be continued unless your caregiver directs you otherwise.  Do not travel far distances unless it is absolutely necessary and only with the approval  of your caregiver.  Take prenatal classes to understand, practice, and ask questions about the labor and delivery.  Make a trial run to the hospital.  Pack your hospital bag.  Prepare the baby's nursery.  Continue to go to all your prenatal visits as directed by your caregiver. SEEK MEDICAL CARE IF:  You are unsure if you are in labor or if your water has broken.  You have dizziness.  You have mild pelvic cramps, pelvic pressure, or nagging pain in your abdominal area.  You have persistent nausea, vomiting, or diarrhea.  You have a bad smelling vaginal discharge.  You have pain with urination. SEEK IMMEDIATE MEDICAL CARE IF:   You have a fever.  You are leaking fluid from your vagina.  You have spotting or bleeding from your vagina.  You have severe abdominal cramping or pain.  You have rapid weight loss or gain.  You have shortness of breath with chest pain.  You notice sudden or extreme swelling   of your face, hands, ankles, feet, or legs.  You have not felt your baby move in over an hour.  You have severe headaches that do not go away with medicine.  You have vision changes. Document Released: 07/16/2001 Document Revised: 07/27/2013 Document Reviewed: 09/22/2012 [employer] Patient Information 2015 [employer]. This information is not intended to replace advice given to you by your health care provider. Make sure you discuss any questions you have with your health care provider.  

## 2014-11-10 ENCOUNTER — Encounter: Payer: Self-pay | Admitting: *Deleted

## 2014-11-10 ENCOUNTER — Ambulatory Visit (INDEPENDENT_AMBULATORY_CARE_PROVIDER_SITE_OTHER): Payer: Medicaid Other | Admitting: *Deleted

## 2014-11-10 VITALS — BP 116/77 | HR 96 | Wt 253.0 lb

## 2014-11-10 DIAGNOSIS — O24419 Gestational diabetes mellitus in pregnancy, unspecified control: Secondary | ICD-10-CM

## 2014-11-10 DIAGNOSIS — O24414 Gestational diabetes mellitus in pregnancy, insulin controlled: Secondary | ICD-10-CM

## 2014-11-14 ENCOUNTER — Ambulatory Visit (INDEPENDENT_AMBULATORY_CARE_PROVIDER_SITE_OTHER): Payer: Medicaid Other | Admitting: Advanced Practice Midwife

## 2014-11-14 VITALS — BP 116/77 | HR 99 | Wt 253.0 lb

## 2014-11-14 DIAGNOSIS — Z3483 Encounter for supervision of other normal pregnancy, third trimester: Secondary | ICD-10-CM

## 2014-11-14 DIAGNOSIS — O24419 Gestational diabetes mellitus in pregnancy, unspecified control: Secondary | ICD-10-CM

## 2014-11-14 NOTE — Progress Notes (Signed)
Active baby. Taking 1.25 mg QAM and 2.5 QHS. Fastings 70's, 2 hour PC 100's. Much better since starting to take morning dose w/ food. Growth [place] ordered.

## 2014-11-14 NOTE — Patient Instructions (Signed)
Labor Induction  Labor induction is when steps are taken to cause a pregnant woman to begin the labor process. Most women go into labor on their own between 37 weeks and 42 weeks of the pregnancy. When this does not happen or when there is a medical need, methods may be used to induce labor. Labor induction causes a pregnant woman's uterus to contract. It also causes the cervix to soften (ripen), open (dilate), and thin out (efface). Usually, labor is not induced before 39 weeks of the pregnancy unless there is a problem with the baby or mother.  Before inducing labor, your health care provider will consider a number of factors, including the following:  The medical condition of you and the baby.   How many weeks along you are.   The status of the baby's lung maturity.   The condition of the cervix.   The position of the baby.  WHAT ARE THE REASONS FOR LABOR INDUCTION? Labor may be induced for the following reasons:  The health of the baby or mother is at risk.   The pregnancy is overdue by 1 week or more.   The water breaks but labor does not start on its own.   The mother has a health condition or serious illness, such as high blood pressure, infection, placental abruption, or diabetes.  The amniotic fluid amounts are low around the baby.   The baby is distressed.  Convenience or wanting the baby to be born on a certain date is not a reason for inducing labor. WHAT METHODS ARE USED FOR LABOR INDUCTION? Several methods of labor induction may be used, such as:   Prostaglandin medicine. This medicine causes the cervix to dilate and ripen. The medicine will also start contractions. It can be taken by mouth or by inserting a suppository into the vagina.   Inserting a thin tube (catheter) with a balloon on the end into the vagina to dilate the cervix. Once inserted, the balloon is expanded with water, which causes the cervix to open.   Stripping the membranes. Your health  care provider separates amniotic sac tissue from the cervix, causing the cervix to be stretched and causing the release of a hormone called progesterone. This may cause the uterus to contract. It is often done during an office visit. You will be sent home to wait for the contractions to begin. You will then come in for an induction.   Breaking the water. Your health care provider makes a hole in the amniotic sac using a small instrument. Once the amniotic sac breaks, contractions should begin. This may still take hours to see an effect.   Medicine to trigger or strengthen contractions. This medicine is given through an IV access tube inserted into a vein in your arm.  All of the methods of induction, besides stripping the membranes, will be done in the hospital. Induction is done in the hospital so that you and the baby can be carefully monitored.  HOW LONG DOES IT TAKE FOR LABOR TO BE INDUCED? Some inductions can take up to 2-3 days. Depending on the cervix, it usually takes less time. It takes longer when you are induced early in the pregnancy or if this is your first pregnancy. If a mother is still pregnant and the induction has been going on for 2-3 days, either the mother will be sent home or a cesarean delivery will be needed. WHAT ARE THE RISKS ASSOCIATED WITH LABOR INDUCTION? Some of the risks of induction   include:   Changes in fetal heart rate, such as too high, too low, or erratic.   Fetal distress.   Chance of infection for the mother and baby.   Increased chance of having a cesarean delivery.   Breaking off (abruption) of the placenta from the uterus (rare).   Uterine rupture (very rare).  When induction is needed for medical reasons, the benefits of induction may outweigh the risks. WHAT ARE SOME REASONS FOR NOT INDUCING LABOR? Labor induction should not be done if:   It is shown that your baby does not tolerate labor.   You have had previous surgeries on your  uterus, such as a myomectomy or the removal of fibroids.   Your placenta lies very low in the uterus and blocks the opening of the cervix (placenta previa).   Your baby is not in a head-down position.   The umbilical cord drops down into the birth canal in front of the baby. This could cut off the baby's blood and oxygen supply.   You have had a previous cesarean delivery.   There are unusual circumstances, such as the baby being extremely premature.  Document Released: 12/11/2006 Document Revised: 03/24/2013 Document Reviewed: 02/18/2013 [employer] Patient Information 2015 [employer]. This information is not intended to replace advice given to you by your health care provider. Make sure you discuss any questions you have with your health care provider.  

## 2014-11-14 NOTE — Progress Notes (Signed)
NST 11-10-14 reviewed and reactive

## 2014-11-17 ENCOUNTER — Encounter (HOSPITAL_COMMUNITY): Payer: Self-pay

## 2014-11-17 ENCOUNTER — Ambulatory Visit (HOSPITAL_COMMUNITY)
Admission: RE | Admit: 2014-11-17 | Discharge: 2014-11-17 | Disposition: A | Discharge status: Home / Self Care | Payer: Medicaid Other | Source: Ambulatory Visit | Attending: Advanced Practice Midwife | Admitting: Advanced Practice Midwife

## 2014-11-17 ENCOUNTER — Other Ambulatory Visit: Payer: Medicaid Other

## 2014-11-17 DIAGNOSIS — O24419 Gestational diabetes mellitus in pregnancy, unspecified control: Secondary | ICD-10-CM

## 2014-11-17 DIAGNOSIS — O3660X0 Maternal care for excessive fetal growth, unspecified trimester, not applicable or unspecified: Secondary | ICD-10-CM | POA: Insufficient documentation

## 2014-11-17 DIAGNOSIS — O34219 Maternal care for unspecified type scar from previous cesarean delivery: Secondary | ICD-10-CM | POA: Insufficient documentation

## 2014-11-17 DIAGNOSIS — Z3A37 37 weeks gestation of pregnancy: Secondary | ICD-10-CM | POA: Insufficient documentation

## 2014-11-21 ENCOUNTER — Encounter: Payer: Self-pay | Admitting: *Deleted

## 2014-11-21 ENCOUNTER — Ambulatory Visit (INDEPENDENT_AMBULATORY_CARE_PROVIDER_SITE_OTHER): Payer: Medicaid Other | Admitting: Advanced Practice Midwife

## 2014-11-21 ENCOUNTER — Encounter: Payer: Self-pay | Admitting: Advanced Practice Midwife

## 2014-11-21 VITALS — BP 135/85 | HR 89 | Wt 256.0 lb

## 2014-11-21 DIAGNOSIS — O24113 Pre-existing diabetes mellitus, type 2, in pregnancy, third trimester: Secondary | ICD-10-CM | POA: Diagnosis not present

## 2014-11-21 DIAGNOSIS — B3731 Acute candidiasis of vulva and vagina: Secondary | ICD-10-CM | POA: Insufficient documentation

## 2014-11-21 DIAGNOSIS — Z3483 Encounter for supervision of other normal pregnancy, third trimester: Secondary | ICD-10-CM

## 2014-11-21 DIAGNOSIS — B373 Candidiasis of vulva and vagina: Secondary | ICD-10-CM

## 2014-11-21 LAB — OB RESULTS CONSOLE GBS: STREP GROUP B AG: POSITIVE

## 2014-11-21 LAB — OB RESULTS CONSOLE GC/CHLAMYDIA
CHLAMYDIA, DNA PROBE: NEGATIVE
GC PROBE AMP, GENITAL: NEGATIVE

## 2014-11-21 MED ORDER — FLUCONAZOLE 150 MG PO TABS: 150.0000 mg | ORAL_TABLET | Freq: Once | ORAL | Status: DC

## 2014-11-21 NOTE — Progress Notes (Signed)
May have yeast infection with itching. CBG [place]- 18,40,37,54,36,06,77,03,40,35,24,81,85/90,93

## 2014-11-21 NOTE — Progress Notes (Signed)
C/O vaginal itching, severe at times. Wet prep collected. Rx diflucan. NST reactive. States blood sugars are all within range. Takes morning dose with FBS now, had been taking it at 10am.  US showed EFW = 8+14.  Dr Lisbeth Renshaw states head was in pelvis, so measurement is probably underestimated.  Risk of shoulder dystocia was discussed. Patient is hopeful she will deliver early. Cultures done

## 2014-11-21 NOTE — Patient Instructions (Signed)

## 2014-11-22 LAB — WET PREP FOR TRICH, YEAST, CLUE
Clue Cells Wet Prep HPF POC: NONE SEEN
Trich, Wet Prep: NONE SEEN

## 2014-11-22 LAB — GC/CHLAMYDIA PROBE AMP
CT Probe RNA: NEGATIVE
GC Probe RNA: NEGATIVE

## 2014-11-22 LAB — CULTURE, BETA STREP (GROUP B ONLY)

## 2014-11-24 ENCOUNTER — Ambulatory Visit (INDEPENDENT_AMBULATORY_CARE_PROVIDER_SITE_OTHER): Payer: Medicaid Other | Admitting: *Deleted

## 2014-11-24 VITALS — BP 133/86 | HR 89 | Wt 256.0 lb

## 2014-11-24 DIAGNOSIS — O24113 Pre-existing diabetes mellitus, type 2, in pregnancy, third trimester: Secondary | ICD-10-CM | POA: Diagnosis not present

## 2014-11-29 ENCOUNTER — Encounter: Payer: Self-pay | Admitting: Advanced Practice Midwife

## 2014-11-29 ENCOUNTER — Ambulatory Visit (INDEPENDENT_AMBULATORY_CARE_PROVIDER_SITE_OTHER): Payer: Medicaid Other | Admitting: Advanced Practice Midwife

## 2014-11-29 VITALS — BP 124/83 | HR 98 | Wt 256.0 lb

## 2014-11-29 DIAGNOSIS — O0993 Supervision of high risk pregnancy, unspecified, third trimester: Secondary | ICD-10-CM

## 2014-11-29 DIAGNOSIS — O3663X1 Maternal care for excessive fetal growth, third trimester, fetus 1: Secondary | ICD-10-CM

## 2014-11-29 DIAGNOSIS — O99820 Streptococcus B carrier state complicating pregnancy: Secondary | ICD-10-CM | POA: Insufficient documentation

## 2014-11-29 DIAGNOSIS — O24419 Gestational diabetes mellitus in pregnancy, unspecified control: Secondary | ICD-10-CM | POA: Diagnosis not present

## 2014-11-29 DIAGNOSIS — IMO0001 Reserved for inherently not codable concepts without codable children: Secondary | ICD-10-CM

## 2014-11-29 NOTE — Progress Notes (Signed)
Increased BH. Very active baby. Scant bloody show e/ exam. Fast CBGs 58-90 (Sometimes mildly symptomatic, but does better if she has evening snack.) 2 hour PC CBGs 80's-115. Pt still desires. TOLAC, but understands that baby's health is highest priority.

## 2014-11-29 NOTE — Progress Notes (Signed)
FASTING CBG-71,63,72,58,78,90,68,79

## 2014-11-29 NOTE — Patient Instructions (Signed)
Braxton Hicks Contractions °Contractions of the uterus can occur throughout pregnancy. Contractions are not always a sign that you are in labor.  °WHAT ARE BRAXTON HICKS CONTRACTIONS?  °Contractions that occur before labor are called Braxton Hicks contractions, or false labor. Toward the end of pregnancy (32-34 weeks), these contractions can develop more often and may become more forceful. This is not true labor because these contractions do not result in opening (dilatation) and thinning of the cervix. They are sometimes difficult to tell apart from true labor because these contractions can be forceful and people have different pain tolerances. You should not feel embarrassed if you go to the hospital with false labor. Sometimes, the only way to tell if you are in true labor is for your health care provider to look for changes in the cervix. °If there are no prenatal problems or other health problems associated with the pregnancy, it is completely safe to be sent home with false labor and await the onset of true labor. °HOW CAN YOU TELL THE DIFFERENCE BETWEEN TRUE AND FALSE LABOR? °False Labor °· The contractions of false labor are usually shorter and not as hard as those of true labor.   °· The contractions are usually irregular.   °· The contractions are often felt in the front of the lower abdomen and in the groin.   °· The contractions may go away when you walk around or change positions while lying down.   °· The contractions get weaker and are shorter lasting as time goes on.   °· The contractions do not usually become progressively stronger, regular, and closer together as with true labor.   °True Labor °1. Contractions in true labor last 30-70 seconds, become very regular, usually become more intense, and increase in frequency.   °2. The contractions do not go away with walking.   °3. The discomfort is usually felt in the top of the uterus and spreads to the lower abdomen and low back.   °4. True labor can  be determined by your health care provider with an exam. This will show that the cervix is dilating and getting thinner.   °WHAT TO REMEMBER °· Keep up with your usual exercises and follow other instructions given by your health care provider.   °· Take medicines as directed by your health care provider.   °· Keep your regular prenatal appointments.   °· Eat and drink lightly if you think you are going into labor.   °· If Braxton Hicks contractions are making you uncomfortable:   °· Change your position from lying down or resting to walking, or from walking to resting.   °· Sit and rest in a tub of warm water.   °· Drink 2-3 glasses of water. Dehydration may cause these contractions.   °· Do slow and deep breathing several times an hour.   °WHEN SHOULD I SEEK IMMEDIATE MEDICAL CARE? °Seek immediate medical care if: °· Your contractions become stronger, more regular, and closer together.   °· You have fluid leaking or gushing from your vagina.   °· You have a fever.   °· You pass blood-tinged mucus.   °· You have vaginal bleeding.   °· You have continuous abdominal pain.   °· You have low back pain that you never had before.   °· You feel your baby's head pushing down and causing pelvic pressure.   °· Your baby is not moving as much as it used to.   °Document Released: 07/22/2005 Document Revised: 07/27/2013 Document Reviewed: 05/03/2013 °[employer]® Patient Information ©2015 [employer]. This information is not intended to replace advice given to you by your health care   provider. Make sure you discuss any questions you have with your health care provider. ° °Fetal Movement Counts °Patient Name: __________________________________________________ Patient Due Date: ____________________ °Performing a fetal movement count is highly recommended in high-risk pregnancies, but it is good for every pregnant woman to do. Your health care provider may ask you to start counting fetal movements at 28 weeks of the pregnancy. Fetal  movements often increase: °· After eating a full meal. °· After physical activity. °· After eating or drinking something sweet or cold. °· At rest. °Pay attention to when you feel the baby is most active. This will help you notice a pattern of your baby's sleep and wake cycles and what factors contribute to an increase in fetal movement. It is important to perform a fetal movement count at the same time each day when your baby is normally most active.  °HOW TO COUNT FETAL MOVEMENTS °5. Find a quiet and comfortable area to sit or lie down on your left side. Lying on your left side provides the best blood and oxygen circulation to your baby. °6. Write down the day and time on a sheet of paper or in a journal. °7. Start counting kicks, flutters, swishes, rolls, or jabs in a 2-hour period. You should feel at least 10 movements within 2 hours. °8. If you do not feel 10 movements in 2 hours, wait 2-3 hours and count again. Look for a change in the pattern or not enough counts in 2 hours. °SEEK MEDICAL CARE IF: °· You feel less than 10 counts in 2 hours, tried twice. °· There is no movement in over an hour. °· The pattern is changing or taking longer each day to reach 10 counts in 2 hours. °· You feel the baby is not moving as he or she usually does. °Date: ____________ Movements: ____________ Start time: ____________ Finish time: ____________  °Date: ____________ Movements: ____________ Start time: ____________ Finish time: ____________ °Date: ____________ Movements: ____________ Start time: ____________ Finish time: ____________ °Date: ____________ Movements: ____________ Start time: ____________ Finish time: ____________ °Date: ____________ Movements: ____________ Start time: ____________ Finish time: ____________ °Date: ____________ Movements: ____________ Start time: ____________ Finish time: ____________ °Date: ____________ Movements: ____________ Start time: ____________ Finish time: ____________ °Date: ____________  Movements: ____________ Start time: ____________ Finish time: ____________  °Date: ____________ Movements: ____________ Start time: ____________ Finish time: ____________ °Date: ____________ Movements: ____________ Start time: ____________ Finish time: ____________ °Date: ____________ Movements: ____________ Start time: ____________ Finish time: ____________ °Date: ____________ Movements: ____________ Start time: ____________ Finish time: ____________ °Date: ____________ Movements: ____________ Start time: ____________ Finish time: ____________ °Date: ____________ Movements: ____________ Start time: ____________ Finish time: ____________ °Date: ____________ Movements: ____________ Start time: ____________ Finish time: ____________  °Date: ____________ Movements: ____________ Start time: ____________ Finish time: ____________ °Date: ____________ Movements: ____________ Start time: ____________ Finish time: ____________ °Date: ____________ Movements: ____________ Start time: ____________ Finish time: ____________ °Date: ____________ Movements: ____________ Start time: ____________ Finish time: ____________ °Date: ____________ Movements: ____________ Start time: ____________ Finish time: ____________ °Date: ____________ Movements: ____________ Start time: ____________ Finish time: ____________ °Date: ____________ Movements: ____________ Start time: ____________ Finish time: ____________  °Date: ____________ Movements: ____________ Start time: ____________ Finish time: ____________ °Date: ____________ Movements: ____________ Start time: ____________ Finish time: ____________ °Date: ____________ Movements: ____________ Start time: ____________ Finish time: ____________ °Date: ____________ Movements: ____________ Start time: ____________ Finish time: ____________ °Date: ____________ Movements: ____________ Start time: ____________ Finish time: ____________ °Date: ____________ Movements: ____________ Start time:  ____________ Finish time: ____________ °Date: ____________ Movements:   ____________ Start time: ____________ Finish time: ____________  °Date: ____________ Movements: ____________ Start time: ____________ Finish time: ____________ °Date: ____________ Movements: ____________ Start time: ____________ Finish time: ____________ °Date: ____________ Movements: ____________ Start time: ____________ Finish time: ____________ °Date: ____________ Movements: ____________ Start time: ____________ Finish time: ____________ °Date: ____________ Movements: ____________ Start time: ____________ Finish time: ____________ °Date: ____________ Movements: ____________ Start time: ____________ Finish time: ____________ °Date: ____________ Movements: ____________ Start time: ____________ Finish time: ____________  °Date: ____________ Movements: ____________ Start time: ____________ Finish time: ____________ °Date: ____________ Movements: ____________ Start time: ____________ Finish time: ____________ °Date: ____________ Movements: ____________ Start time: ____________ Finish time: ____________ °Date: ____________ Movements: ____________ Start time: ____________ Finish time: ____________ °Date: ____________ Movements: ____________ Start time: ____________ Finish time: ____________ °Date: ____________ Movements: ____________ Start time: ____________ Finish time: ____________ °Date: ____________ Movements: ____________ Start time: ____________ Finish time: ____________  °Date: ____________ Movements: ____________ Start time: ____________ Finish time: ____________ °Date: ____________ Movements: ____________ Start time: ____________ Finish time: ____________ °Date: ____________ Movements: ____________ Start time: ____________ Finish time: ____________ °Date: ____________ Movements: ____________ Start time: ____________ Finish time: ____________ °Date: ____________ Movements: ____________ Start time: ____________ Finish time: ____________ °Date:  ____________ Movements: ____________ Start time: ____________ Finish time: ____________ °Date: ____________ Movements: ____________ Start time: ____________ Finish time: ____________  °Date: ____________ Movements: ____________ Start time: ____________ Finish time: ____________ °Date: ____________ Movements: ____________ Start time: ____________ Finish time: ____________ °Date: ____________ Movements: ____________ Start time: ____________ Finish time: ____________ °Date: ____________ Movements: ____________ Start time: ____________ Finish time: ____________ °Date: ____________ Movements: ____________ Start time: ____________ Finish time: ____________ °Date: ____________ Movements: ____________ Start time: ____________ Finish time: ____________ °Document Released: 08/21/2006 Document Revised: 12/06/2013 Document Reviewed: 05/18/2012 °[employer]® Patient Information ©2015 [employer]. This information is not intended to replace advice given to you by your health care provider. Make sure you discuss any questions you have with your health care provider. ° °

## 2014-12-01 ENCOUNTER — Encounter (HOSPITAL_COMMUNITY): Payer: Self-pay | Admitting: *Deleted

## 2014-12-01 ENCOUNTER — Inpatient Hospital Stay (HOSPITAL_COMMUNITY)
Admission: AD | Admit: 2014-12-01 | Discharge: 2014-12-01 | Disposition: A | Discharge status: Home / Self Care | Payer: Medicaid Other | Source: Ambulatory Visit | Attending: Obstetrics & Gynecology | Admitting: Obstetrics & Gynecology

## 2014-12-01 DIAGNOSIS — Z3A38 38 weeks gestation of pregnancy: Secondary | ICD-10-CM | POA: Insufficient documentation

## 2014-12-01 DIAGNOSIS — O99820 Streptococcus B carrier state complicating pregnancy: Secondary | ICD-10-CM

## 2014-12-01 DIAGNOSIS — B3731 Acute candidiasis of vulva and vagina: Secondary | ICD-10-CM

## 2014-12-01 DIAGNOSIS — B373 Candidiasis of vulva and vagina: Secondary | ICD-10-CM

## 2014-12-01 NOTE — MAU Note (Addendum)
Pt states she has been having contractions for about 3 days and contractions became stronger over the last couple of hours

## 2014-12-01 NOTE — Discharge Instructions (Signed)
Third Trimester of Pregnancy The third trimester is from week 29 through week 42, months 7 through 9. The third trimester is a time when the fetus is growing rapidly. At the end of the ninth month, the fetus is about 20 inches in length and weighs 6-10 pounds.  BODY CHANGES Your body goes through many changes during pregnancy. The changes vary from woman to woman.   Your weight will continue to increase. You can expect to gain 25-35 pounds (11-16 kg) by the end of the pregnancy.  You may begin to get stretch marks on your hips, abdomen, and breasts.  You may urinate more often because the fetus is moving lower into your pelvis and pressing on your bladder.  You may develop or continue to have heartburn as a result of your pregnancy.  You may develop constipation because certain hormones are causing the muscles that push waste through your intestines to slow down.  You may develop hemorrhoids or swollen, bulging veins (varicose veins).  You may have pelvic pain because of the weight gain and pregnancy hormones relaxing your joints between the bones in your pelvis. Backaches may result from overexertion of the muscles supporting your posture.  You may have changes in your hair. These can include thickening of your hair, rapid growth, and changes in texture. Some women also have hair loss during or after pregnancy, or hair that feels dry or thin. Your hair will most likely return to normal after your baby is born.  Your breasts will continue to grow and be tender. A yellow discharge may leak from your breasts called colostrum.  Your belly button may stick out.  You may feel short of breath because of your expanding uterus.  You may notice the fetus "dropping," or moving lower in your abdomen.  You may have a bloody mucus discharge. This usually occurs a few days to a week before labor begins.  Your cervix becomes thin and soft (effaced) near your due date. WHAT TO EXPECT AT YOUR  PRENATAL EXAMS  You will have prenatal exams every 2 weeks until week 36. Then, you will have weekly prenatal exams. During a routine prenatal visit:  You will be weighed to make sure you and the fetus are growing normally.  Your blood pressure is taken.  Your abdomen will be measured to track your baby's growth.  The fetal heartbeat will be listened to.  Any test results from the previous visit will be discussed.  You may have a cervical check near your due date to see if you have effaced. At around 36 weeks, your caregiver will check your cervix. At the same time, your caregiver will also perform a test on the secretions of the vaginal tissue. This test is to determine if a type of bacteria, Group B streptococcus, is present. Your caregiver will explain this further. Your caregiver may ask you:  What your birth plan is.  How you are feeling.  If you are feeling the baby move.  If you have had any abnormal symptoms, such as leaking fluid, bleeding, severe headaches, or abdominal cramping.  If you have any questions. Other tests or screenings that may be performed during your third trimester include:  Blood tests that check for low iron levels (anemia).  Fetal testing to check the health, activity level, and growth of the fetus. Testing is done if you have certain medical conditions or if there are problems during the pregnancy. FALSE LABOR You may feel small, irregular contractions  that eventually go away. These are called Braxton Hicks contractions, or false labor. Contractions may last for hours, days, or even weeks before true labor sets in. If contractions come at regular intervals, intensify, or become painful, it is best to be seen by your caregiver.  SIGNS OF LABOR   Menstrual-like cramps.  Contractions that are 5 minutes apart or less.  Contractions that start on the top of the uterus and spread down to the lower abdomen and back.  A sense of increased pelvic  pressure or back pain.  A watery or bloody mucus discharge that comes from the vagina. If you have any of these signs before the 37th week of pregnancy, call your caregiver right away. You need to go to the hospital to get checked immediately. HOME CARE INSTRUCTIONS   Avoid all smoking, herbs, alcohol, and unprescribed drugs. These chemicals affect the formation and growth of the baby.  Follow your caregiver's instructions regarding medicine use. There are medicines that are either safe or unsafe to take during pregnancy.  Exercise only as directed by your caregiver. Experiencing uterine cramps is a good sign to stop exercising.  Continue to eat regular, healthy meals.  Wear a good support bra for breast tenderness.  Do not use hot tubs, steam rooms, or saunas.  Wear your seat belt at all times when driving.  Avoid raw meat, uncooked cheese, cat litter boxes, and soil used by cats. These carry germs that can cause birth defects in the baby.  Take your prenatal vitamins.  Try taking a stool softener (if your caregiver approves) if you develop constipation. Eat more high-fiber foods, such as fresh vegetables or fruit and whole grains. Drink plenty of fluids to keep your urine clear or pale yellow.  Take warm sitz baths to soothe any pain or discomfort caused by hemorrhoids. Use hemorrhoid cream if your caregiver approves.  If you develop varicose veins, wear support hose. Elevate your feet for 15 minutes, 3-4 times a day. Limit salt in your diet.  Avoid heavy lifting, wear low heal shoes, and practice good posture.  Rest a lot with your legs elevated if you have leg cramps or low back pain.  Visit your dentist if you have not gone during your pregnancy. Use a soft toothbrush to brush your teeth and be gentle when you floss.  A sexual relationship may be continued unless your caregiver directs you otherwise.  Do not travel far distances unless it is absolutely necessary and only  with the approval of your caregiver.  Take prenatal classes to understand, practice, and ask questions about the labor and delivery.  Make a trial run to the hospital.  Pack your hospital bag.  Prepare the baby's nursery.  Continue to go to all your prenatal visits as directed by your caregiver. SEEK MEDICAL CARE IF:  You are unsure if you are in labor or if your water has broken.  You have dizziness.  You have mild pelvic cramps, pelvic pressure, or nagging pain in your abdominal area.  You have persistent nausea, vomiting, or diarrhea.  You have a bad smelling vaginal discharge.  You have pain with urination. SEEK IMMEDIATE MEDICAL CARE IF:   You have a fever.  You are leaking fluid from your vagina.  You have spotting or bleeding from your vagina.  You have severe abdominal cramping or pain.  You have rapid weight loss or gain.  You have shortness of breath with chest pain.  You notice sudden or extreme  swelling of your face, hands, ankles, feet, or legs.  You have not felt your baby move in over an hour.  You have severe headaches that do not go away with medicine.  You have vision changes. Document Released: 07/16/2001 Document Revised: 07/27/2013 Document Reviewed: 09/22/2012 [facility] Patient Information 2015 [place], [place]. This information is not intended to replace advice given to you by your health care provider. Make sure you discuss any questions you have with your health care provider. Fetal Movement Counts Patient Name: __________________________________________________ Patient Due Date: ____________________ Performing a fetal movement count is highly recommended in high-risk pregnancies, but it is good for every pregnant woman to do. Your health care provider may ask you to start counting fetal movements at 28 weeks of the pregnancy. Fetal movements often increase:  After eating a full meal.  After physical activity.  After eating or drinking  something sweet or cold.  At rest. Pay attention to when you feel the baby is most active. This will help you notice a pattern of your baby's sleep and wake cycles and what factors contribute to an increase in fetal movement. It is important to perform a fetal movement count at the same time each day when your baby is normally most active.  HOW TO COUNT FETAL MOVEMENTS  Find a quiet and comfortable area to sit or lie down on your left side. Lying on your left side provides the best blood and oxygen circulation to your baby.  Write down the day and time on a sheet of paper or in a journal.  Start counting kicks, flutters, swishes, rolls, or jabs in a 2-hour period. You should feel at least 10 movements within 2 hours.  If you do not feel 10 movements in 2 hours, wait 2-3 hours and count again. Look for a change in the pattern or not enough counts in 2 hours. SEEK MEDICAL CARE IF:  You feel less than 10 counts in 2 hours, tried twice.  There is no movement in over an hour.  The pattern is changing or taking longer each day to reach 10 counts in 2 hours.  You feel the baby is not moving as he or she usually does. Date: ____________ Movements: ____________ Start time: ____________ Jenny Marshall time: ____________  Date: ____________ Movements: ____________ Start time: ____________ Jenny Marshall time: ____________ Date: ____________ Movements: ____________ Start time: ____________ Jenny Marshall time: ____________ Date: ____________ Movements: ____________ Start time: ____________ Jenny Marshall time: ____________ Date: ____________ Movements: ____________ Start time: ____________ Jenny Marshall time: ____________ Date: ____________ Movements: ____________ Start time: ____________ Jenny Marshall time: ____________ Date: ____________ Movements: ____________ Start time: ____________ Jenny Marshall time: ____________ Date: ____________ Movements: ____________ Start time: ____________ Jenny Marshall time: ____________  Date: ____________ Movements:  ____________ Start time: ____________ Jenny Marshall time: ____________ Date: ____________ Movements: ____________ Start time: ____________ Jenny Marshall time: ____________ Date: ____________ Movements: ____________ Start time: ____________ Jenny Marshall time: ____________ Date: ____________ Movements: ____________ Start time: ____________ Jenny Marshall time: ____________ Date: ____________ Movements: ____________ Start time: ____________ Jenny Marshall time: ____________ Date: ____________ Movements: ____________ Start time: ____________ Jenny Marshall time: ____________ Date: ____________ Movements: ____________ Start time: ____________ Jenny Marshall time: ____________  Date: ____________ Movements: ____________ Start time: ____________ Jenny Marshall time: ____________ Date: ____________ Movements: ____________ Start time: ____________ Jenny Marshall time: ____________ Date: ____________ Movements: ____________ Start time: ____________ Jenny Marshall time: ____________ Date: ____________ Movements: ____________ Start time: ____________ Jenny Marshall time: ____________ Date: ____________ Movements: ____________ Start time: ____________ Jenny Marshall time: ____________ Date: ____________ Movements: ____________ Start time: ____________ Jenny Marshall time: ____________ Date: ____________ Movements: ____________ Start time: ____________ Jenny Marshall  time: ____________  Date: ____________ Movements: ____________ Start time: ____________ Jenny Marshall time: ____________ Date: ____________ Movements: ____________ Start time: ____________ Jenny Marshall time: ____________ Date: ____________ Movements: ____________ Start time: ____________ Jenny Marshall time: ____________ Date: ____________ Movements: ____________ Start time: ____________ Jenny Marshall time: ____________ Date: ____________ Movements: ____________ Start time: ____________ Jenny Marshall time: ____________ Date: ____________ Movements: ____________ Start time: ____________ Jenny Marshall time: ____________ Date: ____________ Movements: ____________ Start time: ____________ Jenny Marshall  time: ____________  Date: ____________ Movements: ____________ Start time: ____________ Jenny Marshall time: ____________ Date: ____________ Movements: ____________ Start time: ____________ Jenny Marshall time: ____________ Date: ____________ Movements: ____________ Start time: ____________ Jenny Marshall time: ____________ Date: ____________ Movements: ____________ Start time: ____________ Jenny Marshall time: ____________ Date: ____________ Movements: ____________ Start time: ____________ Jenny Marshall time: ____________ Date: ____________ Movements: ____________ Start time: ____________ Jenny Marshall time: ____________ Date: ____________ Movements: ____________ Start time: ____________ Jenny Marshall time: ____________  Date: ____________ Movements: ____________ Start time: ____________ Jenny Marshall time: ____________ Date: ____________ Movements: ____________ Start time: ____________ Jenny Marshall time: ____________ Date: ____________ Movements: ____________ Start time: ____________ Jenny Marshall time: ____________ Date: ____________ Movements: ____________ Start time: ____________ Jenny Marshall time: ____________ Date: ____________ Movements: ____________ Start time: ____________ Jenny Marshall time: ____________ Date: ____________ Movements: ____________ Start time: ____________ Jenny Marshall time: ____________ Date: ____________ Movements: ____________ Start time: ____________ Jenny Marshall time: ____________  Date: ____________ Movements: ____________ Start time: ____________ Jenny Marshall time: ____________ Date: ____________ Movements: ____________ Start time: ____________ Jenny Marshall time: ____________ Date: ____________ Movements: ____________ Start time: ____________ Jenny Marshall time: ____________ Date: ____________ Movements: ____________ Start time: ____________ Jenny Marshall time: ____________ Date: ____________ Movements: ____________ Start time: ____________ Jenny Marshall time: ____________ Date: ____________ Movements: ____________ Start time: ____________ Jenny Marshall time: ____________ Date: ____________  Movements: ____________ Start time: ____________ Jenny Marshall time: ____________  Date: ____________ Movements: ____________ Start time: ____________ Jenny Marshall time: ____________ Date: ____________ Movements: ____________ Start time: ____________ Jenny Marshall time: ____________ Date: ____________ Movements: ____________ Start time: ____________ Jenny Marshall time: ____________ Date: ____________ Movements: ____________ Start time: ____________ Jenny Marshall time: ____________ Date: ____________ Movements: ____________ Start time: ____________ Jenny Marshall time: ____________ Date: ____________ Movements: ____________ Start time: ____________ Jenny Marshall time: ____________ Document Released: 08/21/2006 Document Revised: 12/06/2013 Document Reviewed: 05/18/2012 [employer] Patient Information 2015 [employer]. This information is not intended to replace advice given to you by your health care provider. Make sure you discuss any questions you have with your health care provider.

## 2014-12-01 NOTE — MAU Note (Signed)
Contractions, mucous discharge

## 2014-12-02 ENCOUNTER — Inpatient Hospital Stay (HOSPITAL_COMMUNITY)
Admission: AD | Admit: 2014-12-02 | Discharge: 2014-12-03 | Disposition: A | Discharge status: Home / Self Care | Payer: Medicaid Other | Source: Ambulatory Visit | Attending: Obstetrics and Gynecology | Admitting: Obstetrics and Gynecology

## 2014-12-02 ENCOUNTER — Ambulatory Visit (INDEPENDENT_AMBULATORY_CARE_PROVIDER_SITE_OTHER): Payer: Medicaid Other | Admitting: Family

## 2014-12-02 VITALS — BP 139/83 | HR 88

## 2014-12-02 DIAGNOSIS — O471 False labor at or after 37 completed weeks of gestation: Secondary | ICD-10-CM | POA: Diagnosis not present

## 2014-12-02 DIAGNOSIS — E119 Type 2 diabetes mellitus without complications: Secondary | ICD-10-CM | POA: Insufficient documentation

## 2014-12-02 DIAGNOSIS — Z3A38 38 weeks gestation of pregnancy: Secondary | ICD-10-CM | POA: Insufficient documentation

## 2014-12-02 DIAGNOSIS — O09523 Supervision of elderly multigravida, third trimester: Secondary | ICD-10-CM | POA: Insufficient documentation

## 2014-12-02 DIAGNOSIS — O24113 Pre-existing diabetes mellitus, type 2, in pregnancy, third trimester: Secondary | ICD-10-CM | POA: Diagnosis not present

## 2014-12-02 DIAGNOSIS — Z3483 Encounter for supervision of other normal pregnancy, third trimester: Secondary | ICD-10-CM

## 2014-12-02 DIAGNOSIS — O24419 Gestational diabetes mellitus in pregnancy, unspecified control: Secondary | ICD-10-CM

## 2014-12-02 DIAGNOSIS — R0989 Other specified symptoms and signs involving the circulatory and respiratory systems: Secondary | ICD-10-CM

## 2014-12-02 HISTORY — DX: Gestational (pregnancy-induced) hypertension without significant proteinuria, unspecified trimester: O13.9

## 2014-12-02 NOTE — Progress Notes (Signed)
Pt here for NST and reports contractions every 7 minutes.  Denies vaginal bleeding or leaking of fluid.  CBGS wnl.  Reviewed last ultrasound growth >90%ile, AC>97%ile.  Pt largest baby 11.7oz.  Pt with two prior vaginal deliveries before last csection for breech.  Pt desires TOLAC.  Reviewed risk of should dystocia and possible sequalae.  Pt verbalizes understanding.  Agrees to another follow up ultrasound next week for growth.  NST Reactive.  Contractions every 5-7 minutes.  Cervix 4/50/-3.  Pt plans to go to [facility] for labor evaluation.  Scheduled appt for next Tuesday for NST/OB appt with plan to schedule IOL on Thursday.  OB staff at [facility] notified.

## 2014-12-02 NOTE — MAU Note (Signed)
Irreg ctxs today but stronger since 2200. 4cm last sve. Denies LOF or bleeding.

## 2014-12-03 ENCOUNTER — Encounter (HOSPITAL_COMMUNITY): Payer: Self-pay

## 2014-12-03 DIAGNOSIS — Z3A38 38 weeks gestation of pregnancy: Secondary | ICD-10-CM | POA: Diagnosis not present

## 2014-12-03 DIAGNOSIS — O471 False labor at or after 37 completed weeks of gestation: Secondary | ICD-10-CM

## 2014-12-03 LAB — URINALYSIS, ROUTINE W REFLEX MICROSCOPIC
Bilirubin Urine: NEGATIVE
Glucose, UA: 250 mg/dL — AB
KETONES UR: NEGATIVE mg/dL
Nitrite: NEGATIVE
PH: 6 (ref 5.0–8.0)
PROTEIN: NEGATIVE mg/dL
Specific Gravity, Urine: 1.02 (ref 1.005–1.030)
Urobilinogen, UA: 0.2 mg/dL (ref 0.0–1.0)

## 2014-12-03 LAB — COMPREHENSIVE METABOLIC PANEL
ALBUMIN: 2.6 g/dL — AB (ref 3.5–5.2)
ALK PHOS: 140 U/L — AB (ref 39–117)
ALT: 13 U/L (ref 0–35)
AST: 23 U/L (ref 0–37)
Anion gap: 8 (ref 5–15)
BUN: 6 mg/dL (ref 6–23)
CO2: 18 mmol/L — AB (ref 19–32)
CREATININE: 0.36 mg/dL — AB (ref 0.50–1.10)
Calcium: 8.5 mg/dL (ref 8.4–10.5)
Chloride: 109 mmol/L (ref 96–112)
GFR calc non Af Amer: >90 mL/min (ref >90)
GLUCOSE: 138 mg/dL — AB (ref 70–99)
Potassium: 3.3 mmol/L — ABNORMAL LOW (ref 3.5–5.1)
Sodium: 135 mmol/L (ref 135–145)
TOTAL PROTEIN: 6.4 g/dL (ref 6.0–8.3)
Total Bilirubin: 0.2 mg/dL — ABNORMAL LOW (ref 0.3–1.2)

## 2014-12-03 LAB — URINE MICROSCOPIC-ADD ON

## 2014-12-03 LAB — CBC
HCT: 33.6 % — ABNORMAL LOW (ref 36.0–46.0)
HEMOGLOBIN: 11.6 g/dL — AB (ref 12.0–15.0)
MCH: 30.8 pg (ref 26.0–34.0)
MCHC: 34.5 g/dL (ref 30.0–36.0)
MCV: 89.1 fL (ref 78.0–100.0)
PLATELETS: 180 10*3/uL (ref 150–400)
RBC: 3.77 MIL/uL — ABNORMAL LOW (ref 3.87–5.11)
RDW: 13.9 % (ref 11.5–15.5)
WBC: 5.2 10*3/uL (ref 4.0–10.5)

## 2014-12-03 LAB — PROTEIN / CREATININE RATIO, URINE
Creatinine, Urine: 87 mg/dL
Protein Creatinine Ratio: 0.16 mg/mg{Cre} — ABNORMAL HIGH (ref 0.00–0.15)
Total Protein, Urine: 14 mg/dL

## 2014-12-03 NOTE — Discharge Instructions (Signed)
Hypertension During Pregnancy Hypertension, or high blood pressure, is when there is extra pressure inside your blood vessels that carry blood from the heart to the rest of your body (arteries). It can happen at any time in life, including pregnancy. Hypertension during pregnancy can cause problems for you and your baby. Your baby might not weigh as much as he or she should at birth or might be born early (premature). Very bad cases of hypertension during pregnancy can be life-threatening.  Different types of hypertension can occur during pregnancy. These include:  Chronic hypertension. This happens when a woman has hypertension before pregnancy and it continues during pregnancy.  Gestational hypertension. This is when hypertension develops during pregnancy.  Preeclampsia or toxemia of pregnancy. This is a very serious type of hypertension that develops only during pregnancy. It affects the whole body and can be very dangerous for both mother and baby.  Gestational hypertension and preeclampsia usually go away after your baby is born. Your blood pressure will likely stabilize within 6 weeks. Women who have hypertension during pregnancy have a greater chance of developing hypertension later in life or with future pregnancies. RISK FACTORS There are certain factors that make it more likely for you to develop hypertension during pregnancy. These include:  Having hypertension before pregnancy.  Having hypertension during a previous pregnancy.  Being overweight.  Being older than 40 years.  Being pregnant with more than one baby.  Having diabetes or kidney problems. SIGNS AND SYMPTOMS Chronic and gestational hypertension rarely cause symptoms. Preeclampsia has symptoms, which may include:  Increased protein in your urine. Your health care provider will check for this at every prenatal visit.  Swelling of your hands and face.  Rapid weight gain.  Headaches.  Visual changes.  Being  bothered by light.  Abdominal pain, especially in the upper right area.  Chest pain.  Shortness of breath.  Increased reflexes.  Seizures. These occur with a more severe form of preeclampsia, called eclampsia. DIAGNOSIS  You may be diagnosed with hypertension during a regular prenatal exam. At each prenatal visit, you may have:  Your blood pressure checked.  A urine test to check for protein in your urine. The type of hypertension you are diagnosed with depends on when you developed it. It also depends on your specific blood pressure reading.  Developing hypertension before 20 weeks of pregnancy is consistent with chronic hypertension.  Developing hypertension after 20 weeks of pregnancy is consistent with gestational hypertension.  Hypertension with increased urinary protein is diagnosed as preeclampsia.  Blood pressure measurements that stay above 834 systolic or 196 diastolic are a sign of severe preeclampsia. TREATMENT Treatment for hypertension during pregnancy varies. Treatment depends on the type of hypertension and how serious it is.  If you take medicine for chronic hypertension, you may need to switch medicines.  Medicines called ACE inhibitors should not be taken during pregnancy.  Low-dose aspirin may be suggested for women who have risk factors for preeclampsia.  If you have gestational hypertension, you may need to take a blood pressure medicine that is safe during pregnancy. Your health care provider will recommend the correct medicine.  If you have severe preeclampsia, you may need to be in the hospital. Health care providers will watch you and your baby very closely. You also may need to take medicine called magnesium sulfate to prevent seizures and lower blood pressure.  Sometimes, an early delivery is needed. This may be the case if the condition worsens. It would be  done to protect you and your baby. The only cure for preeclampsia is delivery.  Your health  care provider may recommend that you take one low-dose aspirin (81 mg) each day to help prevent high blood pressure during your pregnancy if you are at risk for preeclampsia. You may be at risk for preeclampsia if:  You had preeclampsia or eclampsia during a previous pregnancy.  Your baby did not grow as expected during a previous pregnancy.  You experienced preterm birth with a previous pregnancy.  You experienced a separation of the placenta from the uterus (placental abruption) during a previous pregnancy.  You experienced the loss of your baby during a previous pregnancy.  You are pregnant with more than one baby.  You have other medical conditions, such as diabetes or an autoimmune disease. HOME CARE INSTRUCTIONS  Schedule and keep all of your regular prenatal care appointments. This is important.  Take medicines only as directed by your health care provider. Tell your health care provider about all medicines you take.  Eat as little salt as possible.  Get regular exercise.  Do not drink alcohol.  Do not use tobacco products.  Do not drink products with caffeine.  Lie on your left side when resting. SEEK IMMEDIATE MEDICAL CARE IF:  You have severe abdominal pain.  You have sudden swelling in your hands, ankles, or face.  You gain 4 pounds (1.8 kg) or more in 1 week.  You vomit repeatedly.  You have vaginal bleeding.  You do not feel your baby moving as much.  You have a headache.  You have blurred or double vision.  You have muscle twitching or spasms.  You have shortness of breath.  You have blue fingernails or lips.  You have blood in your urine. MAKE SURE YOU:  Understand these instructions.  Will watch your condition.  Will get help right away if you are not doing well or get worse. Document Released: 04/09/2011 Document Revised: 12/06/2013 Document Reviewed: 02/18/2013 [facility] Patient Information 2015 [place], [place]. This information is not  intended to replace advice given to you by your health care provider. Make sure you discuss any questions you have with your health care provider.  can occur throughout pregnancy. Contractions are not always a sign that you are in labor.   BRAXTON HICKS CONTRACTIONS:  WHAT ARE BRAXTON HICKS CONTRACTIONS?  Contractions that occur before labor are called Braxton Hicks contractions, or false labor. Toward the end of pregnancy (32-34 weeks), these contractions can develop more often and may become more forceful. This is not true labor because these contractions do not result in opening (dilatation) and thinning of the cervix. They are sometimes difficult to tell apart from true labor because these contractions can be forceful and people have different pain tolerances. You should not feel embarrassed if you go to the hospital with false labor. Sometimes, the only way to tell if you are in true labor is for your health care provider to look for changes in the cervix. If there are no prenatal problems or other health problems associated with the pregnancy, it is completely safe to be sent home with false labor and await the onset of true labor. HOW CAN YOU TELL THE DIFFERENCE BETWEEN TRUE AND FALSE LABOR? False Labor  The contractions of false labor are usually shorter and not as hard as those of true labor.   The contractions are usually irregular.   The contractions are often felt in the front of the lower abdomen  and in the groin.   The contractions may go away when you walk around or change positions while lying down.   The contractions get weaker and are shorter lasting as time goes on.   The contractions do not usually become progressively stronger, regular, and closer together as with true labor.  True Labor  Contractions in true labor last 30-70 seconds, become very regular, usually become more intense, and increase in frequency.   The contractions do not go away with walking.    The discomfort is usually felt in the top of the uterus and spreads to the lower abdomen and low back.   True labor can be determined by your health care provider with an exam. This will show that the cervix is dilating and getting thinner.  WHAT TO REMEMBER  Keep up with your usual exercises and follow other instructions given by your health care provider.   Take medicines as directed by your health care provider.   Keep your regular prenatal appointments.   Eat and drink lightly if you think you are going into labor.   If Braxton Hicks contractions are making you uncomfortable:   Change your position from lying down or resting to walking, or from walking to resting.   Sit and rest in a tub of warm water.   Drink 2-3 glasses of water. Dehydration may cause these contractions.   Do slow and deep breathing several times an hour.  WHEN SHOULD I SEEK IMMEDIATE MEDICAL CARE? Seek immediate medical care if:  Your contractions become stronger, more regular, and closer together.   You have fluid leaking or gushing from your vagina.   You have a fever.   You pass blood-tinged mucus.   You have vaginal bleeding.   You have continuous abdominal pain.   You have low back pain that you never had before.   You feel your baby's head pushing down and causing pelvic pressure.   Your baby is not moving as much as it used to.  Document Released: 07/22/2005 Document Revised: 07/27/2013 Document Reviewed: 05/03/2013 [facility] Patient Information 2015 [place], [place]. This information is not intended to replace advice given to you by your health care provider. Make sure you discuss any questions you have with your health care provider.

## 2014-12-03 NOTE — MA[facility] Provider Note (Signed)
History     CSN: [id number]  Arrival date and time: 12/02/14 2332   None     Chief Complaint  Patient presents with  . Contractions   HPI Jenny Marshall is a 38yo [id number] @ 38.3wks who presents for eval of labor. Having reg ctx q 5 mins that began this evening. Denies leaking or vag bldg.  Denies H/A, visual disturbances, RUQ pain. No N/V/D or fever. Her preg has been followed by the [facility] office and has been remarkable for 1) AMA 2) prev C/S 3) prev macrosomic infant w/ suspected macrosomia this preg 4) Type II DM (glyburide). She refuses a scheduled C/S; has an IOL scheduled on 12/08/14. States that since arrival to MAU her ctx have lessened in freq and intensity.  OB History    Gravida Para Term Preterm AB TAB SAB Ectopic Multiple Living   '4 3 3       3      ' Obstetric Comments   States last baby turned breech at the last minute. They had suspected he was 10lb.      Past Medical History  Diagnosis Date  . Shortness of breath     AGE 89-21; WITH STRESS  . Irritable bowel     X SEVERAL YEARS;  RESOLVED 2006  . Varicose veins   . Anginal pain     AGE 89-21; HAD EVAL; NO DX; OCCURS WITH STRSS ONT BLOOD THINNERS AGE 19  . Anemia     CHRONIC  . H/O varicella   . Low iron   . Obesity   . Gestational diabetes 2014    current pregnancy  . Pregnancy induced hypertension     Past Surgical History  Procedure Laterality Date  . Vulva surgery  at age 50    clitorectomy  . Cesarean section N/A 10/05/2012    Procedure: Primary cesarean section with delivery of baby boy 20. Apgars 8/9.;  Surgeon: Alwyn Pea, MD;  Location: [facility];  Service: Obstetrics;  Laterality: N/A;    Family History  Problem Relation Age of Onset  . Hypertension Father   . Hyperlipidemia Paternal Aunt   . Diabetes Paternal Aunt   . Hypertension Paternal Aunt   . Kidney disease Paternal Aunt   . Hyperlipidemia Paternal Uncle   . Diabetes Paternal Uncle   . Hypertension Paternal Uncle   .  Stroke Paternal Uncle   . Rheum arthritis Paternal Uncle   . Arthritis Maternal Grandmother   . Diabetes Maternal Grandmother   . Hyperlipidemia Maternal Grandmother   . Other Maternal Grandmother     VARICOSE VEINS  . Heart disease Maternal Grandfather   . Vision loss Paternal Grandfather     GLAUCOMA    History  Substance Use Topics  . Smoking status: Never Smoker   . Smokeless tobacco: Never Used  . Alcohol Use: No    Allergies:  Allergies  Allergen Reactions  . Prednisone     PRICKLY FEELING;     Prescriptions prior to admission  Medication Sig Dispense Refill Last Dose  . ACCU-CHEK FASTCLIX LANCETS MISC 1 each by Does not apply route.   12/02/2014 at Unknown time  . Blood Glucose Monitoring Suppl (ACCU-CHEK AVIVA PLUS) W/DEVICE KIT 1 each by Does not apply route.   12/02/2014 at Unknown time  . glucose blood (ACCU-CHEK AVIVA) test strip 1 each.   12/02/2014 at Unknown time  . glyBURIDE (DIABETA) 2.5 MG tablet TAKE 1 TABLET BY MOUTH TWICE DAILY WITH A MEAL  60 tablet 0 Past Week at Unknown time  . glycopyrrolate (ROBINUL) 2 MG tablet Take 0.5 tablets (1 mg total) by mouth 3 (three) times daily as needed. 30 tablet 3 12/02/2014 at Unknown time  . Prenatal Vit-Fe Fumarate-FA (PRENATAL VITAMIN) 27-0.8 MG TABS Take by mouth.   12/02/2014 at Unknown time  . acetaminophen (TYLENOL) 500 MG tablet Take 1,000 mg by mouth every 6 (six) hours as needed for pain (back pain).   Taking  . ibuprofen (ADVIL,MOTRIN) 600 MG tablet Take 1 tablet (600 mg total) by mouth once. 30 tablet 1 Taking  . Vitamin D, Ergocalciferol, (DRISDOL) 50000 UNITS CAPS capsule TAKE ONE CAPSULE BY MOUTH TWO TIMES A WEEK   Taking    ROS Physical Exam   Blood pressure 142/86, pulse 86, temperature 98 F (36.7 C), resp. rate 20, height '5\' 9"'  (1.753 m), weight 117.209 kg (258 lb 6.4 oz), last menstrual period 03/09/2014, SpO2 99 %, unknown if currently breastfeeding.  BPs: 137-145/85-88  Physical Exam   Constitutional: She is oriented to person, place, and time. She appears well-developed.  HENT:  Head: Normocephalic.  Neck: Normal range of motion.  Cardiovascular: Normal rate.   Respiratory: Effort normal.  GI:  EFM 130s, +accels, no decels Ctx initially more reg, but have since spaced out  Abd: soft/gravid  Genitourinary: Vagina normal.  sm white vag d/c Cx unchanged over period of 1.5hrs (post/thick/ext os 3)  Musculoskeletal: Normal range of motion.  Neurological: She is alert and oriented to person, place, and time.  Skin: Skin is warm and dry.  Psychiatric: She has a normal mood and affect. Her behavior is normal. Thought content normal.   CBC    Component Value Date/Time   WBC 5.2 12/03/2014 0120   RBC 3.77* 12/03/2014 0120   HGB 11.6* 12/03/2014 0120   HCT 33.6* 12/03/2014 0120   PLT 180 12/03/2014 0120   MCV 89.1 12/03/2014 0120   MCH 30.8 12/03/2014 0120   MCHC 34.5 12/03/2014 0120   RDW 13.9 12/03/2014 0120   LYMPHSABS 2.4 05/08/2012 1336   MONOABS 0.6 05/08/2012 1336   EOSABS 0.1 05/08/2012 1336   BASOSABS 0.0 05/08/2012 1336   CMP     Component Value Date/Time   NA 135 12/03/2014 0120   K 3.3* 12/03/2014 0120   CL 109 12/03/2014 0120   CO2 18* 12/03/2014 0120   GLUCOSE 138* 12/03/2014 0120   BUN 6 12/03/2014 0120   CREATININE 0.36* 12/03/2014 0120   CREATININE 0.49* 10/03/2012 1545   CALCIUM 8.5 12/03/2014 0120   PROT 6.4 12/03/2014 0120   ALBUMIN 2.6* 12/03/2014 0120   AST 23 12/03/2014 0120   ALT 13 12/03/2014 0120   ALKPHOS 140* 12/03/2014 0120   BILITOT 0.2* 12/03/2014 0120   GFRNONAA >90 12/03/2014 0120   GFRAA >90 12/03/2014 0120   Urine P:C- 0.16  UA: 250 gluc, mod LE (many SE/WBD)  MAU Course  Procedures  MDM Drew CBC/CMET/urine P:C Serial BPs  Assessment and Plan  IUP@ 38.3wks False labor Labile BP  D/C home after BPs in nl range and preeclampsia labs neg F/U as scheduled at next OB visit or sooner w/ labor or s/s  preeclampsia  Alyne Martinson CNM 12/03/2014, 2:34 AM

## 2014-12-06 ENCOUNTER — Ambulatory Visit (INDEPENDENT_AMBULATORY_CARE_PROVIDER_SITE_OTHER): Payer: Medicaid Other | Admitting: Obstetrics & Gynecology

## 2014-12-06 ENCOUNTER — Encounter (HOSPITAL_COMMUNITY): Payer: Self-pay | Admitting: *Deleted

## 2014-12-06 ENCOUNTER — Inpatient Hospital Stay (HOSPITAL_COMMUNITY)
Admission: AD | Admit: 2014-12-06 | Discharge: 2014-12-10 | DRG: 774 | Disposition: A | Discharge status: Home / Self Care | Payer: Medicaid Other | Source: Ambulatory Visit | Attending: Family Medicine | Admitting: Family Medicine

## 2014-12-06 ENCOUNTER — Inpatient Hospital Stay (HOSPITAL_COMMUNITY): Payer: Medicaid Other

## 2014-12-06 ENCOUNTER — Ambulatory Visit (HOSPITAL_COMMUNITY): Payer: Medicaid Other

## 2014-12-06 VITALS — BP 145/78 | HR 95 | Wt 256.0 lb

## 2014-12-06 DIAGNOSIS — O3663X0 Maternal care for excessive fetal growth, third trimester, not applicable or unspecified: Secondary | ICD-10-CM | POA: Diagnosis present

## 2014-12-06 DIAGNOSIS — O09523 Supervision of elderly multigravida, third trimester: Secondary | ICD-10-CM

## 2014-12-06 DIAGNOSIS — Z3483 Encounter for supervision of other normal pregnancy, third trimester: Secondary | ICD-10-CM

## 2014-12-06 DIAGNOSIS — O2653 Maternal hypotension syndrome, third trimester: Secondary | ICD-10-CM | POA: Diagnosis present

## 2014-12-06 DIAGNOSIS — O24424 Gestational diabetes mellitus in childbirth, insulin controlled: Secondary | ICD-10-CM | POA: Diagnosis present

## 2014-12-06 DIAGNOSIS — Z9889 Other specified postprocedural states: Secondary | ICD-10-CM

## 2014-12-06 DIAGNOSIS — O99824 Streptococcus B carrier state complicating childbirth: Secondary | ICD-10-CM | POA: Diagnosis present

## 2014-12-06 DIAGNOSIS — Z3A38 38 weeks gestation of pregnancy: Secondary | ICD-10-CM | POA: Diagnosis present

## 2014-12-06 DIAGNOSIS — B3731 Acute candidiasis of vulva and vagina: Secondary | ICD-10-CM

## 2014-12-06 DIAGNOSIS — O133 Gestational [pregnancy-induced] hypertension without significant proteinuria, third trimester: Secondary | ICD-10-CM | POA: Diagnosis present

## 2014-12-06 DIAGNOSIS — B373 Candidiasis of vulva and vagina: Secondary | ICD-10-CM

## 2014-12-06 DIAGNOSIS — O139 Gestational [pregnancy-induced] hypertension without significant proteinuria, unspecified trimester: Secondary | ICD-10-CM | POA: Diagnosis present

## 2014-12-06 DIAGNOSIS — O24414 Gestational diabetes mellitus in pregnancy, insulin controlled: Secondary | ICD-10-CM

## 2014-12-06 DIAGNOSIS — O322XX0 Maternal care for transverse and oblique lie, not applicable or unspecified: Secondary | ICD-10-CM | POA: Diagnosis present

## 2014-12-06 DIAGNOSIS — Z3A39 39 weeks gestation of pregnancy: Secondary | ICD-10-CM | POA: Diagnosis not present

## 2014-12-06 DIAGNOSIS — Z98891 History of uterine scar from previous surgery: Secondary | ICD-10-CM

## 2014-12-06 DIAGNOSIS — O99820 Streptococcus B carrier state complicating pregnancy: Secondary | ICD-10-CM

## 2014-12-06 LAB — TYPE AND SCREEN
ABO/RH(D): O POS
Antibody Screen: NEGATIVE

## 2014-12-06 LAB — COMPREHENSIVE METABOLIC PANEL
ALK PHOS: 139 U/L — AB (ref 38–126)
ALT: 14 U/L (ref 14–54)
AST: 21 U/L (ref 15–41)
Albumin: 2.8 g/dL — ABNORMAL LOW (ref 3.5–5.0)
Anion gap: 7 (ref 5–15)
BILIRUBIN TOTAL: 0.2 mg/dL — AB (ref 0.3–1.2)
BUN: 6 mg/dL (ref 6–20)
CHLORIDE: 106 mmol/L (ref 101–111)
CO2: 22 mmol/L (ref 22–32)
CREATININE: 0.4 mg/dL — AB (ref 0.44–1.00)
Calcium: 8.7 mg/dL — ABNORMAL LOW (ref 8.9–10.3)
GFR calc Af Amer: >60 mL/min (ref >60)
GFR calc non Af Amer: >60 mL/min (ref >60)
Glucose, Bld: 94 mg/dL (ref 70–99)
Potassium: 3.9 mmol/L (ref 3.5–5.1)
Sodium: 135 mmol/L (ref 135–145)
Total Protein: 6.7 g/dL (ref 6.5–8.1)

## 2014-12-06 LAB — CBC
HEMATOCRIT: 33.1 % — AB (ref 36.0–46.0)
Hemoglobin: 11.5 g/dL — ABNORMAL LOW (ref 12.0–15.0)
MCH: 30.7 pg (ref 26.0–34.0)
MCHC: 34.7 g/dL (ref 30.0–36.0)
MCV: 88.3 fL (ref 78.0–100.0)
Platelets: 193 10*3/uL (ref 150–400)
RBC: 3.75 MIL/uL — AB (ref 3.87–5.11)
RDW: 13.8 % (ref 11.5–15.5)
WBC: 7.1 10*3/uL (ref 4.0–10.5)

## 2014-12-06 MED ORDER — DEXTROSE 5 % IV SOLN
2.5000 10*6.[IU] | INTRAVENOUS | Status: DC
  Administered 2014-12-07 (×2): 2.5 10*6.[IU] via INTRAVENOUS
  Filled 2014-12-06 (×8): qty 2.5

## 2014-12-06 MED ORDER — LIDOCAINE HCL (PF) 1 % IJ SOLN
30.0000 mL | INTRAMUSCULAR | Status: AC | PRN
  Administered 2014-12-07: 30 mL via SUBCUTANEOUS
  Filled 2014-12-06: qty 30

## 2014-12-06 MED ORDER — ACETAMINOPHEN 325 MG PO TABS
650.0000 mg | ORAL_TABLET | ORAL | Status: DC | PRN
  Administered 2014-12-06 – 2014-12-07 (×2): 650 mg via ORAL
  Filled 2014-12-06 (×2): qty 2

## 2014-12-06 MED ORDER — PENICILLIN G POTASSIUM 5000000 UNITS IJ SOLR
5.0000 10*6.[IU] | Freq: Once | INTRAVENOUS | Status: AC
  Administered 2014-12-06: 5 10*6.[IU] via INTRAVENOUS
  Filled 2014-12-06: qty 5

## 2014-12-06 MED ORDER — LACTATED RINGERS IV SOLN: 500.0000 mL | INTRAVENOUS | Status: DC | PRN

## 2014-12-06 MED ORDER — OXYTOCIN 40 UNITS IN LACTATED RINGERS INFUSION - SIMPLE MED
62.5000 mL/h | INTRAVENOUS | Status: DC
  Filled 2014-12-06: qty 1000

## 2014-12-06 MED ORDER — CITRIC ACID-SODIUM CITRATE 334-500 MG/5ML PO SOLN: 30.0000 mL | ORAL | Status: DC | PRN

## 2014-12-06 MED ORDER — PENICILLIN G POTASSIUM 5000000 UNITS IJ SOLR
5.0000 10*6.[IU] | Freq: Once | INTRAMUSCULAR | Status: DC
  Filled 2014-12-06: qty 5

## 2014-12-06 MED ORDER — OXYCODONE-ACETAMINOPHEN 5-325 MG PO TABS: 1.0000 | ORAL_TABLET | ORAL | Status: DC | PRN

## 2014-12-06 MED ORDER — OXYCODONE-ACETAMINOPHEN 5-325 MG PO TABS: 2.0000 | ORAL_TABLET | ORAL | Status: DC | PRN

## 2014-12-06 MED ORDER — OXYTOCIN BOLUS FROM INFUSION: 500.0000 mL | INTRAVENOUS | Status: DC

## 2014-12-06 MED ORDER — ONDANSETRON HCL 4 MG/2ML IJ SOLN: 4.0000 mg | Freq: Four times a day (QID) | INTRAMUSCULAR | Status: DC | PRN

## 2014-12-06 MED ORDER — INSULIN ASPART 100 UNIT/ML ~~LOC~~ SOLN: 0.0000 [IU] | Freq: Three times a day (TID) | SUBCUTANEOUS | Status: DC

## 2014-12-06 MED ORDER — LACTATED RINGERS IV SOLN
INTRAVENOUS | Status: DC
  Administered 2014-12-07: 14:00:00 via INTRAVENOUS

## 2014-12-06 NOTE — Progress Notes (Signed)
POC discussed with pt and spouse.  Pt to walk with intermittent monitoring per policy.

## 2014-12-06 NOTE — Progress Notes (Signed)
Jenny Marshall is a 39 y.o. G4P3003 at [redacted]w[redacted]d by ultrasound admitted for induction of labor due to Diabetes and Hypertension.  Subjective: Has been walking. Feels baby is still transverse  Objective: BP 140/88 mmHg  Pulse 83  Resp 18  LMP 03/09/2014      FHT:  FHR: 140 bpm, variability: moderate,  accelerations:  Present,  decelerations:  Absent UC:   irregular SVE:   Dilation: 5 Effacement (%): 70 Exam by:: Dr Deniece Ree  Labs: Lab Results  Component Value Date   WBC 7.1 12/06/2014   HGB 11.5* 12/06/2014   HCT 33.1* 12/06/2014   MCV 88.3 12/06/2014   PLT 193 12/06/2014    Assessment / Plan: Spontaneous labor, malpresentation  Labor: Latent Phase Preeclampsia:  labs stable Fetal Wellbeing:  Category I Pain Control:  Labor support without medications I/D:  n/a  Will start Penicillin Anticipated MOD:  NSVD  Jenny Marshall 12/06/2014, 8:57 PM

## 2014-12-06 NOTE — H&P (Signed)
LABOR ADMISSION HISTORY AND PHYSICAL  Jenny Marshall is a 39 y.o. female [phone] with IUP at 38w6 by LMP consistent with 20 wk u/s presenting for IOL 2/2 gHTN. She reports +FMs.  Denies LOF, VB, blurry vision, headaches, peripheral edema, RUQ pain.  She plans on breast feeding. She is undecided for birth control.  Plans on bringing child to [facility] for pediatric care after discharge.   Dating: By LMP c/w 20w sono --->  Estimated Date of Delivery: 12/14/14  Sono:    '@[redacted]w[redacted]d' , CWD, normal anatomy, transverse presentation, 10lb 9oz, >90% EFW   Prenatal History/Complications:  Clinic  [place], transferred from [facility]  Dating  LMP and 20 wk Korea Blood type:  O pos  Genetic Screen 1 Screen: Declined   AFP:  Declined     Quad: Declined             NIPS:Declined Antibody: Neg  Anatomic Korea  Normal   >> Serial Korea in 3rd trimester  Rubella:  Neg  GTT Early:  HgbA1C 6.1  (checking CBGs at home). Started on Glyburide 2.23m qhs >   Increased self to 528mq HS RPR:   Neg  TDaP vaccine Plans at 32 week visit 10/19/14 HBsAg:   Neg  Flu vaccine Declined HIV:   Neg  GBS Positive GBS: Positive  Contraception  Undecided Pap:04/26/14  Normal  Baby Food  Breast   Circumcision NA   Pediatrician [facility] Support Person Husband    Past Medical History: Past Medical History  Diagnosis Date  . Shortness of breath     AGE 54-21; WITH STRESS  . Irritable bowel     X SEVERAL YEARS;  RESOLVED 2006  . Varicose veins   . Anginal pain     AGE 54-21; HAD EVAL; NO DX; OCCURS WITH STRSS ONT BLOOD THINNERS AGE 68  . Anemia     CHRONIC  . H/O varicella   . Low iron   . Obesity   . Gestational diabetes 2014    current pregnancy  . Pregnancy induced hypertension     Past Surgical History: Past Surgical History  Procedure Laterality Date  . Vulva surgery  at age [age]  clitorectomy  . Cesarean section N/A 10/05/2012    Procedure: Primary cesarean section with delivery of baby boy  [id number] 8/9.;  Surgeon: SaAlwyn PeaMD;  [facility];  Service: Obstetrics;  Laterality: N/A;    Obstetrical History: OB History    Gravida Para Term Preterm AB TAB SAB Ectopic Multiple Living   '4 3 3       3      ' Obstetric Comments   States last baby turned breech at the last minute. They had suspected he was 10lb.      Social History: History   Social History  . Marital Status: Married    Spouse Name: MOHAMED  . Number of Children: 2  . Years of Education: 16   Occupational History  . HOMEMAKER    Social History Main Topics  . Smoking status: Never Smoker   . Smokeless tobacco: Never Used  . Alcohol Use: No  . Drug Use: No  . Sexual Activity:    Partners: Male    BiPatent examinerrotection: None, Condom   Other Topics Concern  . Not on file   Social History Narrative    Family History: Family History  Problem Relation Age of Onset  . Hypertension Father   .  Hyperlipidemia Paternal Aunt   . Diabetes Paternal Aunt   . Hypertension Paternal Aunt   . Kidney disease Paternal Aunt   . Hyperlipidemia Paternal Uncle   . Diabetes Paternal Uncle   . Hypertension Paternal Uncle   . Stroke Paternal Uncle   . Rheum arthritis Paternal Uncle   . Arthritis Maternal Grandmother   . Diabetes Maternal Grandmother   . Hyperlipidemia Maternal Grandmother   . Other Maternal Grandmother     VARICOSE VEINS  . Heart disease Maternal Grandfather   . Vision loss Paternal Grandfather     GLAUCOMA    Allergies: Allergies  Allergen Reactions  . Prednisone Other (See Comments)    PRICKLY FEELING;     Prescriptions prior to admission  Medication Sig Dispense Refill Last Dose  . glyBURIDE (DIABETA) 2.5 MG tablet Take 1.25-2.5 mg by mouth 2 (two) times daily with a meal. Patient doses according to blood sugar levels.   12/06/2014 at Unknown time  . omeprazole (PRILOSEC) 20 MG capsule Take 20 mg by mouth daily as needed (For heartburn.).   Past Week at Unknown time   . Prenatal Vit-Fe Fumarate-FA (PRENATAL MULTIVITAMIN) TABS tablet Take 1 tablet by mouth at bedtime.   12/05/2014 at Unknown time  . ACCU-CHEK FASTCLIX LANCETS MISC 1 each by Does not apply route.   Taking  . Blood Glucose Monitoring Suppl (ACCU-CHEK AVIVA PLUS) W/DEVICE KIT 1 each by Does not apply route.   Taking  . glucose blood (ACCU-CHEK AVIVA) test strip 1 each.   Taking  . glyBURIDE (DIABETA) 2.5 MG tablet TAKE 1 TABLET BY MOUTH TWICE DAILY WITH A MEAL (Patient not taking: Reported on 12/06/2014) 60 tablet 0 Not Taking at Unknown time    Review of Systems   All systems reviewed and negative except as stated in HPI  Blood pressure 134/85, pulse 98, resp. rate 18, last menstrual period 03/09/2014, unknown if currently breastfeeding. General appearance: alert and mild distress, crying Lungs: clear to auscultation bilaterally, no increased WOB Heart: regular rate and rhythm, no m/r/g Abdomen: soft, non-tender; bowel sounds normal Extremities: WWP, Homans sign is negative, no sign of DVT, +2 DP Neuro: patellar DTRs  Presentation: transverse, leopold 10lbs, 5/70/-2 with no presenting part Fetal monitoringBaseline: 145 bpm, Variability: Good {> 6 bpm), Accelerations: Reactive and Decelerations: Absent Uterine activityq5-23mnutes      Prenatal labs: ABO, Rh:  O pos Antibody:  Neg Rubella:  Immune RPR:   Nonreactive HBsAg:   Neg HIV:   Nonreactive GBS: Positive (04/18 0000)  1 hr Glucola 133 Genetic screening  declined Anatomy UK[place]ormal  Prenatal Transfer Tool  Maternal Diabetes: Yes:  Diabetes Type:  Pre-pregnancy, Insulin/Medication controlled Genetic Screening: Declined Maternal Ultrasounds/Referrals: Normal Fetal Ultrasounds or other Referrals:  Fetal echo Maternal Substance Abuse:  No Significant Maternal Medications:  Meds include: Other: Glyburide Significant Maternal Lab Results: Lab values include: Group B Strep positive  No results found for this or any previous  visit (from the past 24 hour(s)).  Patient Active Problem List   Diagnosis Date Noted  . Group B Streptococcus carrier, antepartum 11/29/2014  . Yeast vaginitis 11/21/2014  . Uterine scar from previous cesarean delivery, antepartum   . Fetal macrosomia   . Suspected macroscopic fetus 10/20/2014  . Obesity affecting pregnancy in third trimester, antepartum   . GDM (gestational diabetes mellitus)   . History of macrosomia in infant in prior pregnancy, currently pregnant in second trimester 07/20/2014  . Hx of preeclampsia, prior pregnancy, currently pregnant  06/20/2014  . H/O gestational diabetes in prior pregnancy, currently pregnant 06/20/2014  . Elevated glycated hemoglobin 05/04/2014  . Advanced maternal age in multigravida 04/26/2014  . H/O previous obstetrical problem 04/26/2014  . High-risk pregnancy 04/26/2014  . H/O cesarean section 04/26/2014    Assessment: Shavawn Saravia is a 39 y.o. G4P3003 at 1w6dhere for IOL 2/2 gestational HTN with hx of BDM, hx of 11lb+ baby and current macrosomia suspected   #FWB: Cat I #ID:  GBS positive, PCN if indicated #MOF: breast #MOC:undecided #BDM: q2h CBGs #gestational HTN: preE labs, no severe range at this time  #macrosomia: discussed suspected macrosomia, risk of shoulder dystocia, risk of fetal death, malpresentation discussed with patient.  She does not want to repeat cesarean section, states cesarean section would be absolute last resort.  Requests external cephalic version or laboring with hopes that baby will turn prior to delivery.  Discussed risks of TOLAC at length with patient, adamant that she wants a TOL.  Refuses repeat cesarean section at this time.  Will not perform external cephalic version.  Advised of risk of laboring with unstable lie. Risk of uterine rupture at term is 0.78 percent with TOLAC and 0.22 percent with ERCD. 1 in 10 uterine ruptures will result in neonatal death or neurological injury. The benefits of a  trial of labor after cesarean (TOLAC) resulting in a vaginal birth after cesarean (VBAC) include the following: shorter length of hospital stay and postpartum recovery (in most cases); fewer complications, such as postpartum fever, wound or uterine infection, thromboembolism (blood clots in the leg or lung), need for blood transfusion and fewer neonatal breathing problems. The risks of an attempted VBAC or TOLAC include the following: Risk of failed trial of labor after cesarean (TOLAC) without a vaginal birth after cesarean (VBAC) resulting in repeat cesarean delivery (RCD) in about 20 to 42percent of women who attempt VBAC.  Risk of rupture of uterus resulting in an emergency cesarean delivery. The risk of uterine rupture may be related in part to the type of uterine incision made during the first cesarean delivery. A previous transverse uterine incision has the lowest risk of rupture (0.2 to 1.5 percent risk). Vertical or T-shaped uterine incisions have a higher risk of uterine rupture (4 to 9 percent risk)The risk of fetal death is very low with both VBAC and elective repeat cesarean delivery (ERCD), but the likelihood of fetal death is higher with VBAC than with ERCD. Maternal death is very rare with either type of delivery. The risks of an elective repeat cesarean delivery (ERCD) were reviewed with the patient including but not limited to: 09/998 risk of uterine rupture which could have serious consequences, bleeding which may require transfusion; infection which may require antibiotics; injury to bowel, bladder or other surrounding organs (bowel, bladder, ureters); injury to the fetus; need for additional procedures including hysterectomy in the event of a life-threatening hemorrhage; thromboembolic phenomenon; abnormal placentation; incisional problems; death and other postoperative or anesthesia complications.    These risks and benefits are summarized on the consent form, which was reviewed with the  patient during the visit.  All her questions answered and she signed a consent indicating a preference for TOLAC/ERCD. A copy of the consent was given to the patient.   AMerla Riches MD 12/06/2014, 5:34 PM

## 2014-12-06 NOTE — Progress Notes (Signed)
Jenny Marshall is a 39 y.o. Q2W9798 at [redacted]w[redacted]d admitted for induction of labor due to gestational hypertension.  Subjective: Patient felt baby shift.  Having contractions lasting 1.5-2 minutes.  Has been walking around  Objective: BP 143/89 mmHg  Pulse 88  Resp 18  LMP 03/09/2014      FHT:  FHR: 145 bpm, variability: moderate,  accelerations:  Present,  decelerations:  Absent UC:   irregular, every 3-6 minutes SVE:   Dilation: 5 Effacement (%): 70 Station: -3 Exam by:: Hansel Feinstein  CNM   Labs: Lab Results  Component Value Date   WBC 7.1 12/06/2014   HGB 11.5* 12/06/2014   HCT 33.1* 12/06/2014   MCV 88.3 12/06/2014   PLT 193 12/06/2014    Assessment / Plan: US done - baby vertex.  Membranes swept - very hesitent to start pitocin as EFW > 4500g.   Pt still declining cesarean section.   Recommended patient to continue to be active. Okay for intermittent monitoring - once in active labor, continuous monitoring due to TOLAC. Will watch labor curve carefully.  Preeclampsia:  none Fetal Wellbeing:  Category I Pain Control:  Labor support without medications I/D:  PCN Anticipated MOD:  uncertain  STINSON, JACOB JEHIEL 12/06/2014, 11:55 PM

## 2014-12-06 NOTE — Progress Notes (Signed)
Discussed at length--VBAC, macrosomia, risk of rpt vaginal tear.  Pt still would like to try VBAC.  She is getting Korea for growth today.  Patient is emotional about upcoing delivery.  She is scared but has chosen the risks of VBAC.  After Korea, will discuss EFW and risk of shoulder dystocia.  Pt feels slightly smaller than last pregnancy which was almost 11 1/2 pounds.  No other issues today.  No problems with CBGs. BP today 145/78, 128/91.  Patient has history of preeclampsia.  Patient has elevated blood pressure today and needs to be induced tomorrow for DM.  Will get Korea for growth and induce tonight for gestational hypertension.

## 2014-12-07 ENCOUNTER — Encounter (HOSPITAL_COMMUNITY): Payer: Self-pay | Admitting: *Deleted

## 2014-12-07 ENCOUNTER — Ambulatory Visit (HOSPITAL_COMMUNITY): Payer: Medicaid Other

## 2014-12-07 DIAGNOSIS — Z3A38 38 weeks gestation of pregnancy: Secondary | ICD-10-CM

## 2014-12-07 DIAGNOSIS — O133 Gestational [pregnancy-induced] hypertension without significant proteinuria, third trimester: Secondary | ICD-10-CM

## 2014-12-07 DIAGNOSIS — O3663X0 Maternal care for excessive fetal growth, third trimester, not applicable or unspecified: Secondary | ICD-10-CM

## 2014-12-07 LAB — PROTEIN / CREATININE RATIO, URINE
CREATININE, URINE: 42 mg/dL
Protein Creatinine Ratio: 0.14 mg/mg{Cre} (ref 0.00–0.15)
Total Protein, Urine: 6 mg/dL

## 2014-12-07 LAB — CBC
HEMATOCRIT: 31.3 % — AB (ref 36.0–46.0)
Hemoglobin: 10.8 g/dL — ABNORMAL LOW (ref 12.0–15.0)
MCH: 30.7 pg (ref 26.0–34.0)
MCHC: 34.5 g/dL (ref 30.0–36.0)
MCV: 88.9 fL (ref 78.0–100.0)
Platelets: 175 10*3/uL (ref 150–400)
RBC: 3.52 MIL/uL — ABNORMAL LOW (ref 3.87–5.11)
RDW: 14 % (ref 11.5–15.5)
WBC: 9.2 10*3/uL (ref 4.0–10.5)

## 2014-12-07 LAB — GLUCOSE, CAPILLARY: Glucose-Capillary: 171 mg/dL — ABNORMAL HIGH (ref 70–99)

## 2014-12-07 LAB — RPR: RPR: NONREACTIVE

## 2014-12-07 MED ORDER — LANOLIN HYDROUS EX OINT: TOPICAL_OINTMENT | CUTANEOUS | Status: DC | PRN

## 2014-12-07 MED ORDER — BENZOCAINE-MENTHOL 20-0.5 % EX AERO
1.0000 "application " | INHALATION_SPRAY | CUTANEOUS | Status: DC | PRN
  Administered 2014-12-07 – 2014-12-10 (×3): 1 via TOPICAL
  Filled 2014-12-07 (×3): qty 56

## 2014-12-07 MED ORDER — ONDANSETRON HCL 4 MG/2ML IJ SOLN: 4.0000 mg | INTRAMUSCULAR | Status: DC | PRN

## 2014-12-07 MED ORDER — DIPHENHYDRAMINE HCL 25 MG PO CAPS: 25.0000 mg | ORAL_CAPSULE | Freq: Four times a day (QID) | ORAL | Status: DC | PRN

## 2014-12-07 MED ORDER — SODIUM CHLORIDE 0.9 % IV SOLN: 250.0000 mL | INTRAVENOUS | Status: DC | PRN

## 2014-12-07 MED ORDER — OXYTOCIN 40 UNITS IN LACTATED RINGERS INFUSION - SIMPLE MED
1.0000 m[IU]/min | INTRAVENOUS | Status: DC
  Administered 2014-12-07: 2 m[IU]/min via INTRAVENOUS
  Administered 2014-12-07: 666 m[IU]/min via INTRAVENOUS

## 2014-12-07 MED ORDER — FENTANYL CITRATE (PF) 100 MCG/2ML IJ SOLN
100.0000 ug | INTRAMUSCULAR | Status: DC | PRN
  Administered 2014-12-07: 100 ug via INTRAVENOUS

## 2014-12-07 MED ORDER — SENNOSIDES-DOCUSATE SODIUM 8.6-50 MG PO TABS
2.0000 | ORAL_TABLET | ORAL | Status: DC
  Administered 2014-12-08 – 2014-12-09 (×3): 2 via ORAL
  Filled 2014-12-07 (×3): qty 2

## 2014-12-07 MED ORDER — OXYTOCIN 40 UNITS IN LACTATED RINGERS INFUSION - SIMPLE MED: 62.5000 mL/h | INTRAVENOUS | Status: DC | PRN

## 2014-12-07 MED ORDER — DIBUCAINE 1 % RE OINT: 1.0000 "application " | TOPICAL_OINTMENT | RECTAL | Status: DC | PRN

## 2014-12-07 MED ORDER — IBUPROFEN 600 MG PO TABS
600.0000 mg | ORAL_TABLET | Freq: Four times a day (QID) | ORAL | Status: DC
  Administered 2014-12-07 – 2014-12-10 (×10): 600 mg via ORAL
  Filled 2014-12-07 (×10): qty 1

## 2014-12-07 MED ORDER — PRENATAL MULTIVITAMIN CH
1.0000 | ORAL_TABLET | Freq: Every day | ORAL | Status: DC
  Administered 2014-12-08 – 2014-12-09 (×2): 1 via ORAL
  Filled 2014-12-07 (×2): qty 1

## 2014-12-07 MED ORDER — ZOLPIDEM TARTRATE 5 MG PO TABS: 5.0000 mg | ORAL_TABLET | Freq: Every evening | ORAL | Status: DC | PRN

## 2014-12-07 MED ORDER — FLEET ENEMA 7-19 GM/118ML RE ENEM: 1.0000 | ENEMA | Freq: Every day | RECTAL | Status: DC | PRN

## 2014-12-07 MED ORDER — BISACODYL 10 MG RE SUPP: 10.0000 mg | Freq: Every day | RECTAL | Status: DC | PRN

## 2014-12-07 MED ORDER — TERBUTALINE SULFATE 1 MG/ML IJ SOLN
0.2500 mg | Freq: Once | INTRAMUSCULAR | Status: DC | PRN
  Filled 2014-12-07: qty 1

## 2014-12-07 MED ORDER — FENTANYL CITRATE (PF) 100 MCG/2ML IJ SOLN: 50.0000 ug | INTRAMUSCULAR | Status: DC | PRN

## 2014-12-07 MED ORDER — WITCH HAZEL-GLYCERIN EX PADS
1.0000 | MEDICATED_PAD | CUTANEOUS | Status: DC | PRN
Start: 2014-12-07 — End: 2014-12-10
  Administered 2014-12-10: 1 via TOPICAL

## 2014-12-07 MED ORDER — SODIUM CHLORIDE 0.9 % IJ SOLN: 3.0000 mL | Freq: Two times a day (BID) | INTRAMUSCULAR | Status: DC

## 2014-12-07 MED ORDER — FENTANYL CITRATE (PF) 100 MCG/2ML IJ SOLN
INTRAMUSCULAR | Status: AC
  Administered 2014-12-07: 100 ug via INTRAVENOUS
  Filled 2014-12-07: qty 2

## 2014-12-07 MED ORDER — ONDANSETRON HCL 4 MG PO TABS: 4.0000 mg | ORAL_TABLET | ORAL | Status: DC | PRN

## 2014-12-07 MED ORDER — SODIUM CHLORIDE 0.9 % IJ SOLN: 3.0000 mL | INTRAMUSCULAR | Status: DC | PRN

## 2014-12-07 MED ORDER — ACETAMINOPHEN 325 MG PO TABS
650.0000 mg | ORAL_TABLET | ORAL | Status: DC | PRN
  Administered 2014-12-09: 650 mg via ORAL
  Filled 2014-12-07: qty 2

## 2014-12-07 MED ORDER — SIMETHICONE 80 MG PO CHEW: 80.0000 mg | CHEWABLE_TABLET | ORAL | Status: DC | PRN

## 2014-12-07 NOTE — Progress Notes (Addendum)
Patient ID: Jenny Marshall, female   DOB: [birth year], 39 y.o.   MRN: [id number] Doing well. Walking off and on Filed Vitals:   12/06/14 2050 12/06/14 2255 12/07/14 0041 12/07/14 0333  BP: 140/88 143/89 131/82 118/72  Pulse: 83 88 92 88  Temp:   98.2 F (36.8 C)   TempSrc:   Oral   Resp:  18 18 18     UCs still spaced out but last 1.5 min FHR reactive, though has had periods of nonreactivity  Some bloody show  Will continue to observe

## 2014-12-07 NOTE — Progress Notes (Signed)
Labor Progress Note  S: comfortable  O:  BP 148/103 mmHg  Pulse 104  Temp(Src) 98.2 F (36.8 C) (Oral)  Resp 18  LMP 03/09/2014 Cat 1 CVE: 6/70/3, no presenting part palpated on gentle exam with bulging bag, bedside sono performed to confirm cephalic presentation  A&P: 39 y.o. E3O1224 [redacted]w[redacted]d IOL 2/2 gHTN and BDM # labor: high risk 2/2 suspected macrosomia, on my exam I believe patient had adequate pelvis for up to 10lb infant, however I explained at length that her baby may be larger.  If at any time protracted labor will recommend cesarean section. Pt agreeable with plan.  Merla Riches, MD 10:17 AM

## 2014-12-07 NOTE — Progress Notes (Signed)
Pt stated she has fluid leaking " that i can't stop".  Small drops of bloody fluid noted on floor.

## 2014-12-07 NOTE — Progress Notes (Signed)
Patient ID: Jenny Marshall, female   DOB: Jun 15, 1976, 39 y.o.   MRN: [id number] Met with patient this morning. Reviewed with the patient ultrasound findings of EFW 4770 gm on 12/06/2014 along with the risks associated with vaginal delivery of macrosomia infant. Discussed risks of shoulder dystocia, possible fetal asphyxiation, neurological impairment and need for emergency cesarean section. It was explained to the patient that recommendations are for a cesarean section. Patient verbalized understanding and wishes to continue forward with TOLAC. I further explained to the patient that in the presence of a protracted labor or prolonged second stage, I will not attempt an operative vaginal delivery and will recommend cesarean delivery at that time. Patient and husband both agreed.

## 2014-12-07 NOTE — Progress Notes (Addendum)
Patient ID: Jenny Marshall, female   DOB: 05/23/1976, 39 y.o.   MRN: [id number] Doing well  Filed Vitals:   12/06/14 1906 12/06/14 2050 12/06/14 2255 12/07/14 0041  BP: 134/89 140/88 143/89 131/82  Pulse: 89 83 88 92  Temp:    98.2 F (36.8 C)  TempSrc:    Oral  Resp: 18  18 18    FHR reactive UCs every 5-6 minutes, long lasting  Dilation: 5 Effacement (%): 70 Station: -3 Presentation: Vertex Exam by:: Prudencio Burly   Results for Jenny Marshall, Jenny Marshall (MRN [id number]) as of 12/07/2014 01:34  Ref. Range 12/06/2014 23:26  Protein Creatinine Ratio Latest Ref Range: 0.00-0.15 mg/mgCre 0.14   Will continue to observe

## 2014-12-08 NOTE — Lactation Note (Signed)
This note was copied from the chart of Jenny Marshall. Lactation Consultation Note  Patient Name: Jenny Ginni Sambrano Today's Date: 12/08/2014 Reason for consult: Initial assessment Visited with Mom, baby 50 hrs old.  This is Mom's 4th baby to breast feed.  Baby assisted to latch using football hold, some suction loss noted, so helped Mom hold baby in closely.  Mom has large nipples in length and diameter.  Mom shown how to sandwich breast to help baby latch deeper onto areola.  Mom stated latch felt more comfortable.  When the suction noise starts, she is to pull baby in closer at chin and cheeks.  Mom encouraged to feed baby skin to skin, when she cues.  Brochure left in room.  Explained about IP and OP lactation services available to her.  To call for help prn, and to [facility] to follow up in am.  Maternal Data Formula Feeding for Exclusion: No Has patient been taught Hand Expression?: Yes Does the patient have breastfeeding experience prior to this delivery?: Yes  Feeding Feeding Type: Breast Fed Length of feed: 20 min  LATCH Score/Interventions Latch: Grasps breast easily, tongue down, lips flanged, rhythmical sucking. Intervention(s): Adjust position;Assist with latch;Breast massage;Breast compression  Audible Swallowing: Spontaneous and intermittent Intervention(s): Hand expression;Skin to skin;Alternate breast massage  Type of Nipple: Everted at rest and after stimulation (large nipple)  Comfort (Breast/Nipple): Soft / non-tender     Hold (Positioning): Assistance needed to correctly position infant at breast and maintain latch. Intervention(s): Breastfeeding basics reviewed;Support Pillows;Position options;Skin to skin  LATCH Score: 9  Lactation Tools Discussed/Used     Consult Status Consult Status: Follow-up Date: 12/09/14 Follow-up type: In-patient    Broadus John 12/08/2014, 6:41 PM

## 2014-12-08 NOTE — Progress Notes (Signed)
Post Partum Day 1 Subjective: no complaints, up ad lib, voiding and tolerating PO  Objective: Blood pressure 124/78, pulse 87, temperature 98.7 F (37.1 C), temperature source Oral, resp. rate 20, last menstrual period 03/09/2014, SpO2 100 %, unknown if currently breastfeeding.  Physical Exam:  General: alert, no distress and moderately obese Lochia: appropriate Uterine Fundus: firm Incision: NA DVT Evaluation: Negative Homan's sign.   Recent Labs  12/06/14 1825 12/07/14 1400  HGB 11.5* 10.8*  HCT 33.1* 31.3*    Assessment/Plan: Plan for discharge tomorrow, Breastfeeding and Contraception LAM   LOS: 2 days   Bianey Tesoro 12/08/2014, 1:40 PM

## 2014-12-08 NOTE — Progress Notes (Signed)
UR chart review completed.  

## 2014-12-08 NOTE — Progress Notes (Signed)
Teaching service MD on call notified that patient requested abdominal binder and agreed that it could be ordered per her request.

## 2014-12-09 LAB — CBC
HCT: 28.6 % — ABNORMAL LOW (ref 36.0–46.0)
Hemoglobin: 9.5 g/dL — ABNORMAL LOW (ref 12.0–15.0)
MCH: 29.8 pg (ref 26.0–34.0)
MCHC: 33.2 g/dL (ref 30.0–36.0)
MCV: 89.7 fL (ref 78.0–100.0)
Platelets: 181 10*3/uL (ref 150–400)
RBC: 3.19 MIL/uL — AB (ref 3.87–5.11)
RDW: 14 % (ref 11.5–15.5)
WBC: 5.3 10*3/uL (ref 4.0–10.5)

## 2014-12-09 MED ORDER — LACTATED RINGERS IV BOLUS (SEPSIS)
500.0000 mL | Freq: Once | INTRAVENOUS | Status: AC
  Administered 2014-12-09: 500 mL via INTRAVENOUS

## 2014-12-09 MED ORDER — IBUPROFEN 600 MG PO TABS: 600.0000 mg | ORAL_TABLET | Freq: Four times a day (QID) | ORAL | Status: DC

## 2014-12-09 MED ORDER — LACTATED RINGERS IV BOLUS (SEPSIS): 500.0000 mL | Freq: Once | INTRAVENOUS | Status: DC

## 2014-12-09 MED ORDER — LACTATED RINGERS IV BOLUS (SEPSIS): 1000.0000 mL | Freq: Once | INTRAVENOUS | Status: DC

## 2014-12-09 MED ORDER — LACTATED RINGERS IV BOLUS (SEPSIS)
1000.0000 mL | Freq: Once | INTRAVENOUS | Status: AC
  Administered 2014-12-09: 1000 mL via INTRAVENOUS

## 2014-12-09 NOTE — Progress Notes (Addendum)
Post Partum Day #2  Subjective: S/P VAVD and 2nd degree lac repair. Earlier This PM- Sharp intermittent headache pain in occiput radiating to posterior neck. Denies dizziness and headache S/p IV lactated ringers bolus 1,023mL. Continues to have orthostatic sx when standing or attempting to walk.  Objective: Blood pressure 149/83, pulse 80, temperature 98.4 F (36.9 C), temperature source Oral, resp. rate 20, last menstrual period 03/09/2014, SpO2 100 %, unknown if currently breastfeeding.  Recent Labs  12/07/14 1400 12/09/14 1305  HGB 10.8* 9.5*  HCT 31.3* 28.6*   Orthostatic VS for the past 24 hrs:  BP- Lying Pulse- Lying BP- Sitting Pulse- Sitting BP- Standing at 0 minutes Pulse- Standing at 0 minutes  12/09/14 1829 - - - - 148/89 mmHg 134  12/09/14 1825 - - 144/85 mmHg 72 - -  12/09/14 1824 140/84 mmHg 72 - - - -  12/09/14 1230 149/83 mmHg 81 139/88 mmHg 98 - -       Assessment/Plan: PPD#2 Orthostatic hypotension resolved with IVF bolus. Hemodynamically stable.    Plan 1. Cancel discharge 2. Continue IV hydration.    LOS: 3 days    Dorina Hoyer MS-3 participated in care Lorene Dy, [place] 12/09/2014 7:23 PM

## 2014-12-09 NOTE — Discharge Summary (Signed)
Obstetric Discharge Summary Reason for Admission: induction of labor for [facility] Prenatal Procedures: NST and ultrasound Intrapartum Procedures: spontaneous vaginal delivery Postpartum Procedures: none Complications-Operative and Postpartum: none  Hospital Course:  Pt had an uncomplicated IOL for GHTN.  She had a mild pph, asymptomatic. Postpartum BP is normal.  Has some occ neck pain. + trigger point right trapezius/occipital. Breastfeeding well. Plans NFP.  Delivery Note At 1:07 PM a viable female was delivered via VBAC, Spontaneous (Presentation: ;  ).  APGAR: 8, 9; weight 10 lb 0.1 oz (4540 g).   Placenta status: Intact, Spontaneous.  Cord: 3 vessels with the following complications: None.    Anesthesia: Local  Episiotomy: None Lacerations: 2nd degree;Vaginal Est. Blood Loss (mL): 850     H/H: Lab Results  Component Value Date/Time   HGB 10.8* 12/07/2014 02:00 PM   HCT 31.3* 12/07/2014 02:00 PM      Discharge Diagnoses: Term Pregnancy-delivered  Discharge Information: Date: 02/14/2011 Activity: pelvic rest Diet: routine  Medications: Ibuprofen Breast feeding:  Yes Condition: stable Instructions: refer to handout Discharge to: home  Ice/massage/stretches for neck pain     Medication List    STOP taking these medications        ACCU-CHEK AVIVA PLUS W/DEVICE Kit     ACCU-CHEK AVIVA test strip  Generic drug:  glucose blood     ACCU-CHEK FASTCLIX LANCETS Misc     glyBURIDE 2.5 MG tablet  Commonly known as:  DIABETA     omeprazole 20 MG capsule  Commonly known as:  PRILOSEC     prenatal multivitamin Tabs tablet      TAKE these medications        ibuprofen 600 MG tablet  Commonly known as:  ADVIL,MOTRIN  Take 1 tablet (600 mg total) by mouth every 6 (six) hours.           Follow-up Information    Follow up with [facility] [place]. Schedule an appointment as soon as possible for a visit in 4 weeks.   Specialty:   Obstetrics and Gynecology   Contact information:   [place], [place] 12/09/2014,7:59 AM

## 2014-12-09 NOTE — Progress Notes (Addendum)
Post Partum Day #2 CTSP pre-discharge due to feeling weak, dizzy, felt like blacking out when walking to BR.   Subjective: S/P VAVD and 2nd degree lac repair. No excessive blood loss. Lochia scant. Sharp intermittent headache pain in occiput, radiating to posterior neck. Some SOB on exertion. No orthopnea.  Objective: Blood pressure 135/81, pulse 80, temperature 98.4 F (36.9 C), temperature source Oral, resp. rate 20, last menstrual period 03/09/2014, SpO2 100 %, unknown if currently breastfeeding. Filed Vitals:   12/08/14 1700 12/08/14 2255 12/09/14 0633 12/09/14 1230  BP: 128/79 127/76 135/81 149/83  Pulse: 79 82 80   Temp: 98.2 F (36.8 C) 98.1 F (36.7 C) 98.4 F (36.9 C)   TempSrc: Oral Oral Oral   Resp: 18 20 20    SpO2:  100%    preE labs were nl on admission  Physical Exam:  General: alert, cooperative, fatigued and no distress Lochia: appropriate Uterine Fundus: firm at lower U Incision: n/a DVT Evaluation: No evidence of DVT seen on physical exam.   Recent Labs  12/07/14 1400 12/09/14 1305  HGB 10.8* 9.5*  HCT 31.3* 28.6*   CBC    Component Value Date/Time   WBC 5.3 12/09/2014 1305   RBC 3.19* 12/09/2014 1305   HGB 9.5* 12/09/2014 1305   HCT 28.6* 12/09/2014 1305   PLT 181 12/09/2014 1305   MCV 89.7 12/09/2014 1305   MCH 29.8 12/09/2014 1305   MCHC 33.2 12/09/2014 1305   RDW 14.0 12/09/2014 1305   LYMPHSABS 2.4 05/08/2012 1336   MONOABS 0.6 05/08/2012 1336   EOSABS 0.1 05/08/2012 1336   BASOSABS 0.0 05/08/2012 1336      Assessment/Plan:  Check CBC and orthostatics Discharge home if H/A responds to ibuptrofen/acetaminophen.    LOS: 3 days   Rosabella Edgin 12/09/2014, 12:12 PM   Addendum:  Orthostatic VS for the past 24 hrs:  BP- Lying Pulse- Lying BP- Sitting Pulse- Sitting  12/09/14 1230 149/83 mmHg 81 139/88 mmHg 98  BP standing 96/83   Pulse standing 155  Will IV hydrate Aarya Quebedeaux Freida Busman, CNM 12/09/2014 1:35 PM

## 2014-12-09 NOTE — Discharge Instructions (Signed)

## 2014-12-09 NOTE — Progress Notes (Signed)
RN at bedside to give dc instructions.  While lying in the bed Pt. Tells the RN that she feels like she is going to pass out when she gets up.  Patient also complains of occassional neck spasms.  She also complains of her ears feeling like they clog up and begin ringing.  Nurse midwife on call notified.  CNM at bedside within 5 minutes of phone call.

## 2014-12-09 NOTE — Progress Notes (Signed)
Spoke with Zella Ball MD student/ Biagio Borg CNM and she stated that mother will be not be discharged and will stay over night.  She also stated that she will cancel the discharge order, and order for mom to receive a bolus of LR 519ml x1.

## 2014-12-09 NOTE — Progress Notes (Signed)
Jenny Marshall CNM notified of patients feelings of fatique and faint after going to the bathroom. Vital signs are WNL and bleeding is WNL at this time.

## 2014-12-10 LAB — CBC
HEMATOCRIT: 30.4 % — AB (ref 36.0–46.0)
Hemoglobin: 10.1 g/dL — ABNORMAL LOW (ref 12.0–15.0)
MCH: 30 pg (ref 26.0–34.0)
MCHC: 33.2 g/dL (ref 30.0–36.0)
MCV: 90.2 fL (ref 78.0–100.0)
Platelets: 202 10*3/uL (ref 150–400)
RBC: 3.37 MIL/uL — AB (ref 3.87–5.11)
RDW: 13.9 % (ref 11.5–15.5)
WBC: 6.5 10*3/uL (ref 4.0–10.5)

## 2014-12-10 MED ORDER — CYCLOBENZAPRINE HCL 5 MG PO TABS: 5.0000 mg | ORAL_TABLET | Freq: Three times a day (TID) | ORAL | Status: DC | PRN

## 2014-12-10 NOTE — Discharge Summary (Signed)
Expand All Collapse All   Obstetric Discharge Summary Reason for Admission: induction of labor for [facility] Prenatal Procedures: NST and ultrasound Intrapartum Procedures: spontaneous vaginal delivery Postpartum Procedures: none Complications-Operative and Postpartum: Orthostatic HTN   Hospital Course:  Pt had an uncomplicated IOL for GHTN. She had a mild pph, asymptomatic. Postpartum BP is normal. Has some occ neck pain. + trigger point right trapezius/occipital. Breastfeeding well. Plans NFP. Stayed additional night because she was having significant dizziness and orthostatic HTN. Improved significantly w/ IV fluids  Delivery Note At 1:07 PM a viable female was delivered via VBAC, Spontaneous (Presentation: ; ). APGAR: 8, 9; weight 10 lb 0.1 oz (4540 g).  Placenta status: Intact, Spontaneous. Cord: 3 vessels with the following complications: None.   Anesthesia: Local  Episiotomy: None Lacerations: 2nd degree;Vaginal Est. Blood Loss (mL): 850     H/H:  Labs (Brief)    Lab Results  Component Value Date/Time   HGB 10.8* 12/07/2014 02:00 PM   HCT 31.3* 12/07/2014 02:00 PM     CBC Latest Ref Rng 12/10/2014 12/09/2014 12/07/2014  WBC 4.0 - 10.5 K/uL 6.5 5.3 9.2  Hemoglobin 12.0 - 15.0 g/dL 10.1(L) 9.5(L) 10.8(L)  Hematocrit 36.0 - 46.0 % 30.4(L) 28.6(L) 31.3(L)  Platelets 150 - 400 K/uL 202 181 175   Discharge Diagnoses: Term Pregnancy-delivered, Orthostatic HTN   Discharge Information: Date: 02/14/2011 Activity: pelvic rest Diet: routine  Medications: Ibuprofen, Iron, Flexeril Breast feeding: Yes Condition: stable Instructions: refer to handout Discharge to: home Ice/massage/stretches for neck pain     Medication List    STOP taking these medications       ACCU-CHEK AVIVA PLUS W/DEVICE Kit     ACCU-CHEK AVIVA test strip  Generic drug: glucose blood     ACCU-CHEK FASTCLIX LANCETS Misc     glyBURIDE 2.5 MG tablet  Commonly  known as: DIABETA     omeprazole 20 MG capsule  Commonly known as: PRILOSEC     prenatal multivitamin Tabs tablet      TAKE these medications       ibuprofen 600 MG tablet  Commonly known as: ADVIL,MOTRIN  Take 1 tablet (600 mg total) by mouth every 6 (six) hours.           Follow-up Information    Follow up with [facility] [place]. Schedule an appointment as soon as possible for a visit in 4 weeks.   Specialty: Obstetrics and Gynecology   Contact information:   [address], [place] [phone]

## 2014-12-12 ENCOUNTER — Telehealth: Payer: Self-pay

## 2014-12-12 NOTE — Telephone Encounter (Signed)
Home health nurse visit patient today for post partum care and called with pt BP 140/104. No headaches, normal swelling for post partum. Pt was advised to go to [facility]U. Patient voiced that the baby have appt. In [place] tomorrow and she wants to wait until then to come to office.

## 2014-12-21 ENCOUNTER — Encounter: Payer: Self-pay | Admitting: Obstetrics & Gynecology

## 2014-12-21 ENCOUNTER — Ambulatory Visit (INDEPENDENT_AMBULATORY_CARE_PROVIDER_SITE_OTHER): Payer: Medicaid Other | Admitting: Obstetrics & Gynecology

## 2014-12-21 VITALS — BP 162/105 | HR 82 | Resp 16 | Ht 69.0 in | Wt 242.0 lb

## 2014-12-21 DIAGNOSIS — M62838 Other muscle spasm: Secondary | ICD-10-CM

## 2014-12-21 DIAGNOSIS — M6248 Contracture of muscle, other site: Secondary | ICD-10-CM | POA: Diagnosis not present

## 2014-12-21 DIAGNOSIS — O139 Gestational [pregnancy-induced] hypertension without significant proteinuria, unspecified trimester: Secondary | ICD-10-CM | POA: Diagnosis not present

## 2014-12-21 DIAGNOSIS — R51 Headache: Secondary | ICD-10-CM

## 2014-12-21 NOTE — Progress Notes (Signed)
Patient ID: Ronisha Bellefeuille, female   DOB: [birth year], 39 y.o.   MRN: [id number] HPI Pt presents with headache since delivery. She complains of light sensitivity. No visual disturbances. Pt has been taking Flexeril with some relief of her headache.  Pt also has pain in right trapezius, right para spinous muscle in neck and at occipital condyle.  Pt denies scotomata.  Pt has said swelling ahs decreased.  No RUQ pain.   Filed Vitals:   12/21/14 1330  BP: 159/102  Pulse: 85  Resp: 16  Height: 5\' 9"  (1.753 m)  Weight: 242 lb (109.77 kg)   Filed Vitals:   12/21/14 1330 12/21/14 1411 12/21/14 1421  BP: 159/102 160/109 162/105  Pulse: 85 80 82  Resp: 16    Height: 5\' 9"  (1.753 m)    Weight: 242 lb (109.77 kg)     Vitals:  hypertension General appearance: alert, cooperative and no distress Head: Normocephalic, without obvious abnormality, atraumatic Eyes: negative Throat: lips, mucosa, and tongue normal; teeth and gums normal Abdomen: soft, non-tender; bowel sounds normal; no masses,  no organomegaly Back and Neck:  Pain and muscle spasm felt in right trapezius, right neck paraspinous. Extremities: +edema Neuro:  DTR 1+, no clonus Skin: no lesions or rash   Procedure:  Injection of tiggerpoints (3) right trapezius, right paraspinous, right occipital condyle  Consent obtained and verified. Time-out conducted. Noted no overlying erythema, induration, or other signs of local infection. Skin prepped in a sterile fashion.. Completed without difficulty. Meds: lidocaine, marcaine, dexamethasone (2:2:1) 1 cc in each area Pain immediately improved suggesting accurate placement of the medication. Advised to call if fevers/chills, erythema, induration, drainage, or persistent bleeding.  BP post injection is still elevated with systolic >016  A/P 39 yo female with headache and muscle spasm  With hypertension could also have severe preeclampsia  1-trigger point injection as above for  headache and muscle spasm. 2-BP elevated and pedal edema 3-Pt needs serial BPs, labs.  Pt to take flexeril (has at home) 4-MAU for evaluation

## 2014-12-23 ENCOUNTER — Ambulatory Visit: Payer: Medicaid Other | Admitting: Certified Nurse Midwife

## 2015-01-16 IMAGING — US US OB DETAIL+14 WK
1 series · 12 of 28 positions shown · non-contrast
Comparison: none

[Series 1: us ob detail+14 wk · 0.20mm/px · 107 acquisitions, 12 frames shown]
[im 4/107]
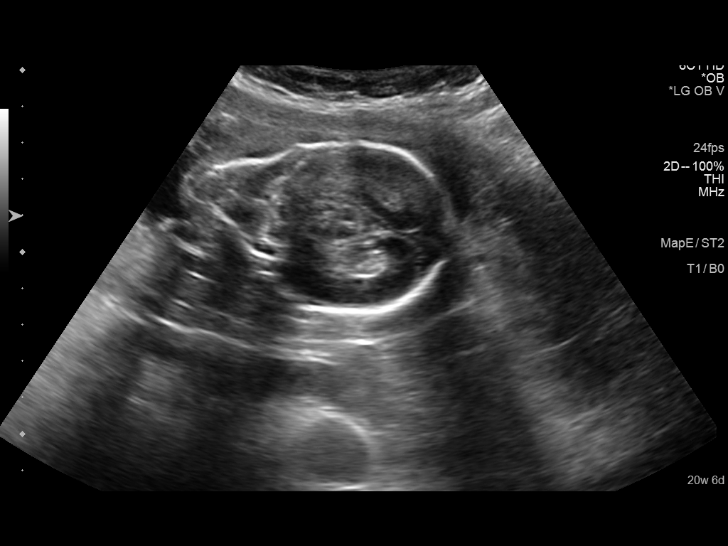
[im 12/107]
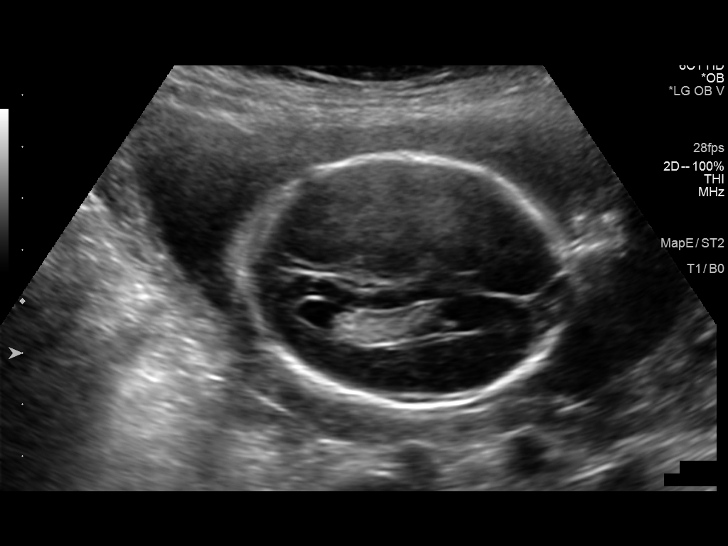
[im 20/107]
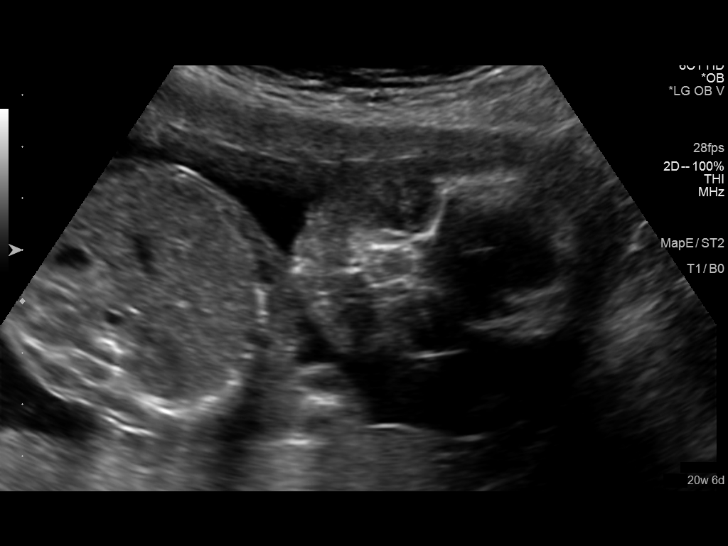
[im 32/107]
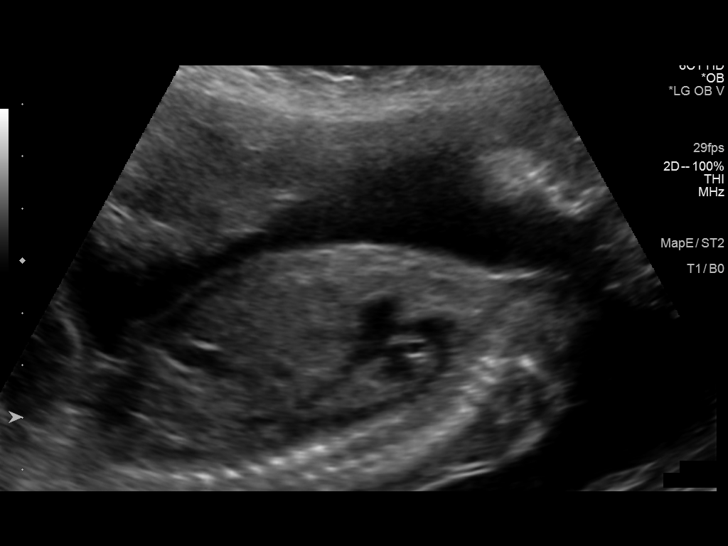
[im 40/107]
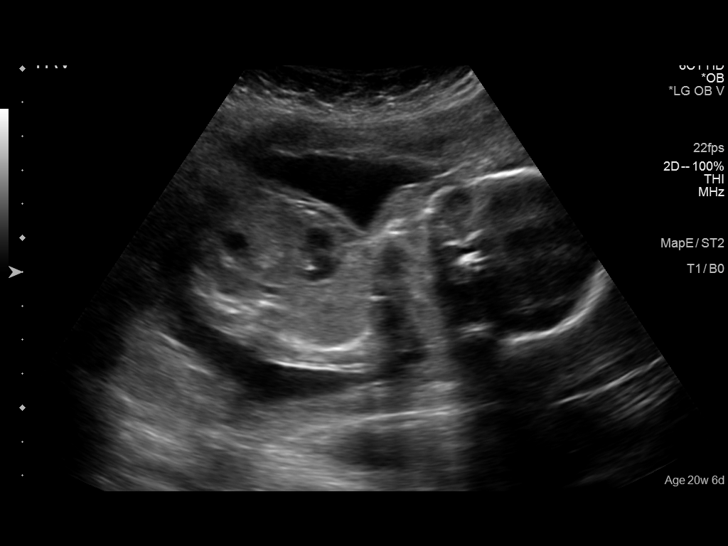
[im 48/107]
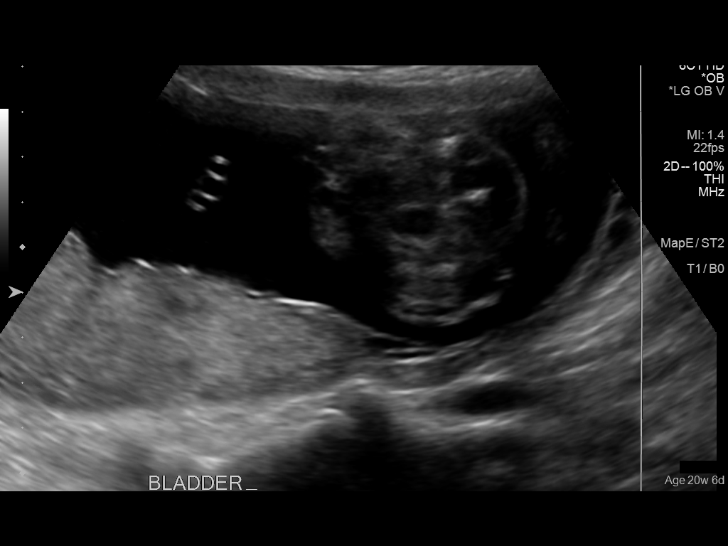
[im 59/107]
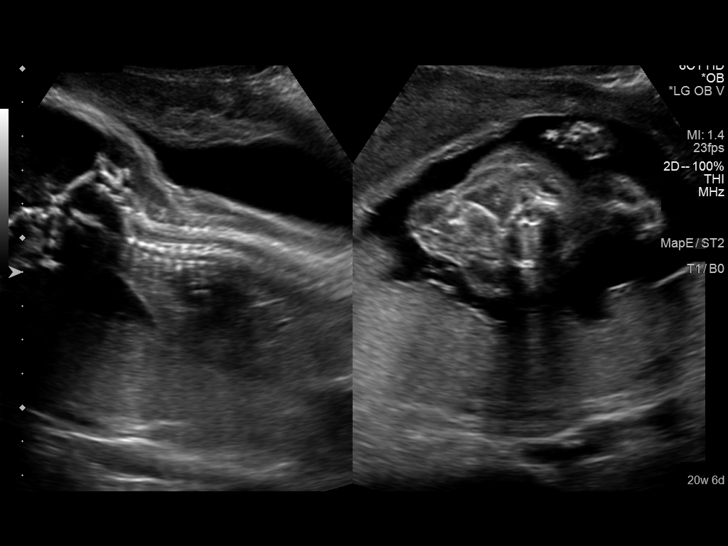
[im 67/107]
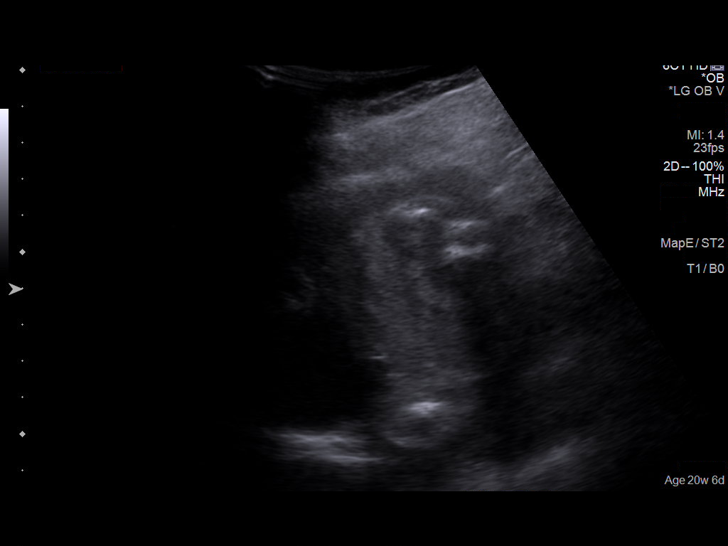
[im 75/107]
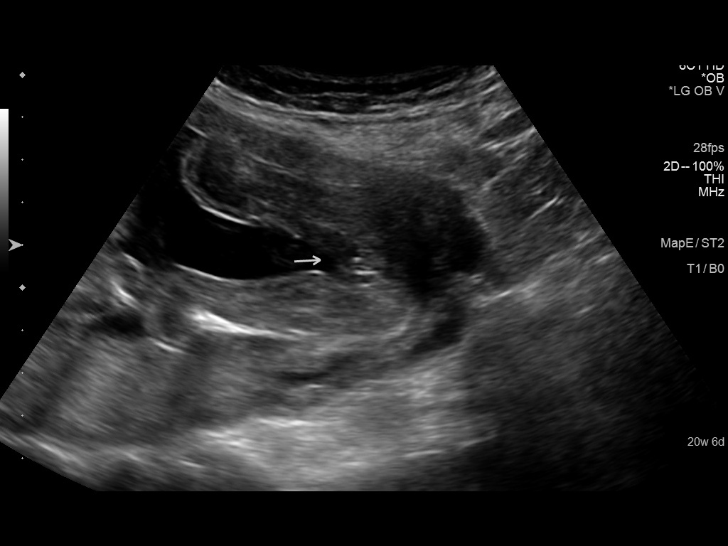
[im 87/107]
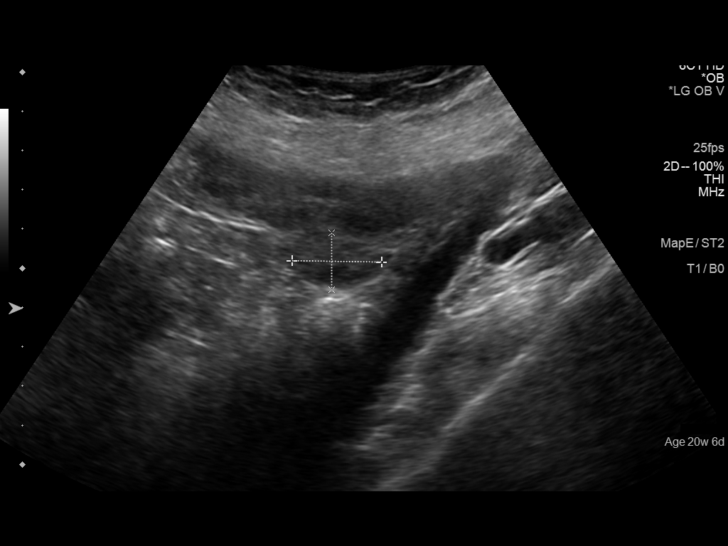
[im 95/107]
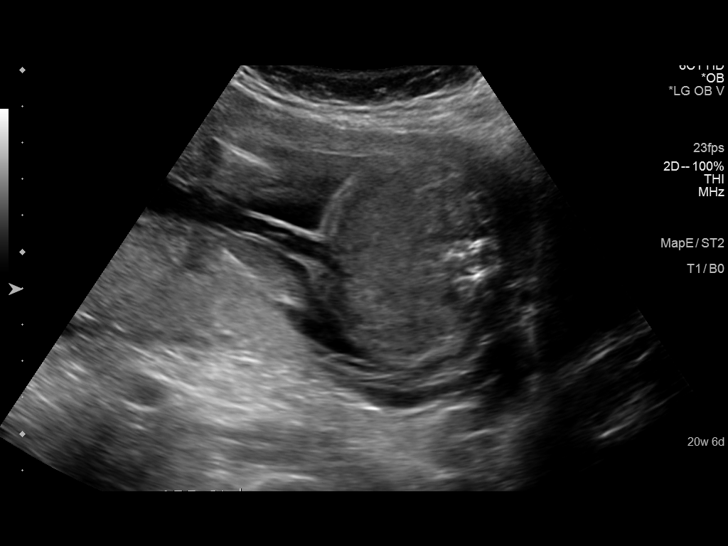
[im 103/107]
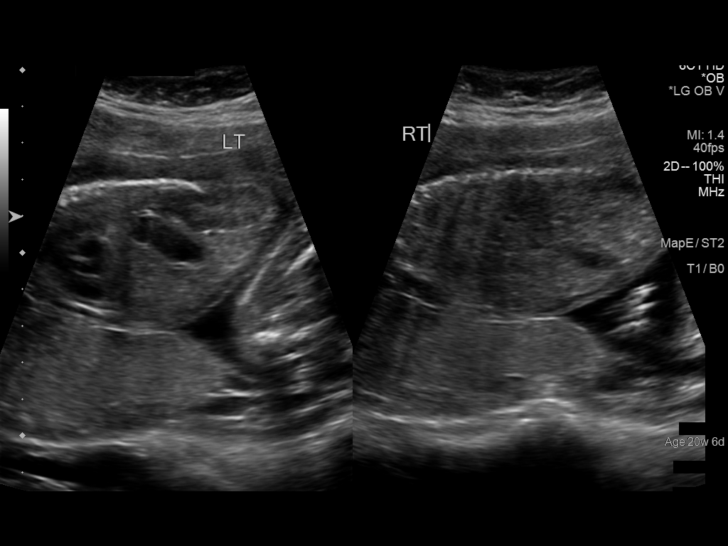

[12 of 28 positions shown; findings below may reference images not displayed]

OBSTETRICS REPORT
                      (Signed Final 08/02/2014 [DATE])

Service(s) Provided

 US OB DETAIL + 14 WK                                  76811.0
Indications

 Diabetes - Gestational
 Poor obstetric history: Previous preeclampsia /
 eclampsia/gestational HTN
 Previous cesarean delivery
 Detailed fetal anatomic survey                        Z36
 20 weeks gestation of pregnancy
 Advanced maternal age multigravida 37+, second
 trimester; declined testing
Fetal Evaluation

 Num Of Fetuses:    1
 Fetal Heart Rate:  155                          bpm
 Cardiac Activity:  Observed
 Presentation:      Variable
 Placenta:          Posterior, above cervical
                    os
 P. Cord            Visualized, central
 Insertion:

 Amniotic Fluid
 AFI FV:      Subjectively within normal limits
                                             Larg Pckt:    7.05  cm
Biometry

 BPD:     48.2  mm     G. Age:  20w 4d                CI:         72.6   70 - 86
 OFD:     66.4  mm                                    FL/HC:      19.1   15.9 -

 HC:     184.8  mm     G. Age:  20w 6d       40  %    HC/AC:      1.05   1.06 -

 AC:       176  mm     G. Age:  22w 4d       89  %    FL/BPD:
 FL:      35.3  mm     G. Age:  21w 1d       51  %    FL/AC:      20.1   20 - 24
 HUM:     34.1  mm     G. Age:  21w 4d       67  %
 CER:       21  mm     G. Age:  20w 0d       31  %
 NFT:      4.6  mm

 Est. FW:     447  gm           1 lb     58  %
Gestational Age
 LMP:           20w 6d        Date:  03/09/14                 EDD:   12/14/14
 U/S Today:     21w 2d                                        EDD:   12/11/14
 Best:          20w 6d     Det. By:  LMP  (03/09/14)          EDD:   12/14/14
Anatomy

 Cranium:          Appears normal         Aortic Arch:      Appears normal
 Fetal Cavum:      Appears normal         Ductal Arch:      Appears normal
 Ventricles:       Appears normal         Diaphragm:        Appears normal
 Choroid Plexus:   Appears normal         Stomach:          Appears normal, left
                                                            sided
 Cerebellum:       Appears normal         Abdomen:          Appears normal
 Posterior Fossa:  Appears normal         Abdominal Wall:   Appears nml (cord
                                                            insert, abd wall)
 Nuchal Fold:      Not applicable (>20    Cord Vessels:     Appears normal (3
                   wks GA)                                  vessel cord)
 Face:             Appears normal         Kidneys:          Appear normal
                   (orbits and profile)
 Lips:             Appears normal         Bladder:          Appears normal
 Heart:            Appears normal         Spine:            Appears normal
                   (4CH, axis, and
                   situs)
 RVOT:             Appears normal         Lower             Appears normal
                                          Extremities:
 LVOT:             Appears normal         Upper             Appears normal
                                          Extremities:

 Other:  Fetus appears to be a female. Heels and 5th digit visualized. Nasal
         bone visualized.
Targeted Anatomy

 Fetal Central Nervous System
 Lat. Ventricles:  5.5                    Cisterna Magna:
Cervix Uterus Adnexa

 Cervical Length:    3.73     cm

 Cervix:       Normal appearance by transabdominal scan.
 Uterus:       No abnormality visualized.
 Cul De Sac:   No free fluid seen.
 Left Ovary:    No adnexal mass visualized.
 Right Ovary:   No adnexal mass visualized.
 Adnexa:     No adnexal mass visualized.
Impression

 SIUP at 20+6 weeks
 Normal detailed fetal anatomy
 Markers of aneuploidy: none
 Normal amniotic fluid volume
 Measurements consistent with LMP dating
Recommendations

 Serial Aujla in third trimester (DM/AMA)
 questions or concerns.

## 2015-01-18 ENCOUNTER — Encounter: Payer: Self-pay | Admitting: Obstetrics & Gynecology

## 2015-01-18 ENCOUNTER — Ambulatory Visit (INDEPENDENT_AMBULATORY_CARE_PROVIDER_SITE_OTHER): Payer: Medicaid Other | Admitting: Obstetrics & Gynecology

## 2015-01-18 VITALS — BP 139/100 | HR 88 | Resp 16 | Ht 70.0 in | Wt 238.0 lb

## 2015-01-18 DIAGNOSIS — Z30014 Encounter for initial prescription of intrauterine contraceptive device: Secondary | ICD-10-CM

## 2015-01-18 DIAGNOSIS — Z30430 Encounter for insertion of intrauterine contraceptive device: Secondary | ICD-10-CM

## 2015-01-18 DIAGNOSIS — Z01812 Encounter for preprocedural laboratory examination: Secondary | ICD-10-CM | POA: Diagnosis not present

## 2015-01-18 DIAGNOSIS — Z0000 Encounter for general adult medical examination without abnormal findings: Secondary | ICD-10-CM

## 2015-01-18 DIAGNOSIS — Z8632 Personal history of gestational diabetes: Secondary | ICD-10-CM

## 2015-01-18 LAB — POCT URINE PREGNANCY: Preg Test, Ur: NEGATIVE

## 2015-01-18 MED ORDER — LEVONORGESTREL 20 MCG/24HR IU IUD
INTRAUTERINE_SYSTEM | Freq: Once | INTRAUTERINE | Status: AC
  Administered 2015-01-18: 12:00:00 via INTRAUTERINE

## 2015-01-18 NOTE — Addendum Note (Signed)
Addended by: Asencion Islam on: 01/18/2015 01:08 PM   Modules accepted: Orders

## 2015-01-18 NOTE — Addendum Note (Signed)
Addended by: Asencion Islam on: 01/18/2015 11:33 AM   Modules accepted: Orders

## 2015-01-18 NOTE — Progress Notes (Signed)
  Subjective:     Jenny Marshall is a 39 y.o. female who presents for a postpartum visit. She is 6 weeks postpartum following a spontaneous vaginal delivery. I have fully reviewed the prenatal and intrapartum course. The delivery was at 38.6 gestational weeks. Outcome: vaginal birth after cesarean (VBAC). Anesthesia: epidural. Postpartum course has been normal. Baby's course has been normal. Baby is feeding by breast. Bleeding no bleeding. Bowel function is normal. Bladder function is normal. Patient is not sexually active. Contraception method is IUD. Postpartum depression screening: negative.  The following portions of the patient's history were reviewed and updated as appropriate: allergies, current medications, past family history, past medical history, past social history, past surgical history and problem list.  Review of Systems Pertinent items are noted in HPI.   Objective:    BP 139/100 mmHg  Pulse 88  Resp 16  Ht 5\' 10"  (1.778 m)  Wt 238 lb (107.956 kg)  BMI 34.15 kg/m2  Breastfeeding? Yes  General:  alert   Breasts:  inspection negative, no nipple discharge or bleeding, no masses or nodularity palpable  Lungs: clear to auscultation bilaterally  Heart:  regular rate and rhythm, S1, S2 normal, no murmur, click, rub or gallop  Abdomen: soft, non-tender; bowel sounds normal; no masses,  no organomegaly   Vulva:  normal except some small amount of granulation tissue. I treated this with silver nitrate.  Vagina: normal vagina  Cervix:  anteverted  Corpus: normal  Adnexa:  no mass, fullness, tenderness  Rectal Exam: Not performed.         UPT negative, consent signed, Time out procedure done. Cervix prepped with betadine and grasped with a single tooth tenaculum. Mirena was easily placed and the strings were cut to 3-4 cm. Uterus sounded to 9 cm. She tolerated the procedure well.   Assessment:     Normal postpartum exam. Pap smear not done at today's visit.   Plan:    1. Contraception: IUD 2.GDM- schedule 2 hour GTT 3. Follow up in: 4 weeks or as needed.

## 2015-01-30 ENCOUNTER — Other Ambulatory Visit: Payer: Self-pay

## 2015-02-01 ENCOUNTER — Other Ambulatory Visit: Payer: Medicaid Other

## 2015-02-01 DIAGNOSIS — O24419 Gestational diabetes mellitus in pregnancy, unspecified control: Secondary | ICD-10-CM

## 2015-02-02 ENCOUNTER — Telehealth: Payer: Self-pay | Admitting: *Deleted

## 2015-02-02 LAB — GLUCOSE TOLERANCE, 2 HOURS W/ 1HR
GLUCOSE, 2 HOUR: 105 mg/dL (ref 70–139)
GLUCOSE, FASTING: 95 mg/dL (ref 70–99)
Glucose, 1 hour: 148 mg/dL (ref 70–170)

## 2015-02-02 NOTE — Telephone Encounter (Signed)
LM on voicemail of normal 2 hr PP glucola

## 2015-02-15 ENCOUNTER — Ambulatory Visit: Payer: Medicaid Other | Admitting: Obstetrics & Gynecology

## 2015-02-16 ENCOUNTER — Ambulatory Visit: Payer: Medicaid Other | Admitting: Obstetrics & Gynecology

## 2015-05-04 ENCOUNTER — Encounter: Payer: Self-pay | Admitting: Obstetrics & Gynecology

## 2015-05-04 ENCOUNTER — Ambulatory Visit (INDEPENDENT_AMBULATORY_CARE_PROVIDER_SITE_OTHER): Payer: Medicaid Other | Admitting: Obstetrics & Gynecology

## 2015-05-04 VITALS — BP 138/100 | HR 90 | Resp 16 | Ht 69.0 in | Wt 235.0 lb

## 2015-05-04 DIAGNOSIS — N921 Excessive and frequent menstruation with irregular cycle: Secondary | ICD-10-CM

## 2015-05-04 DIAGNOSIS — R102 Pelvic and perineal pain: Secondary | ICD-10-CM

## 2015-05-04 DIAGNOSIS — Z975 Presence of (intrauterine) contraceptive device: Principal | ICD-10-CM

## 2015-05-04 DIAGNOSIS — Z30431 Encounter for routine checking of intrauterine contraceptive device: Secondary | ICD-10-CM | POA: Diagnosis not present

## 2015-05-04 MED ORDER — LIDOCAINE HCL 2 % EX GEL: 1.0000 "application " | CUTANEOUS | Status: DC | PRN

## 2015-05-04 NOTE — Progress Notes (Signed)
   Subjective:    Patient ID: Jenny Marshall, female    DOB: [birth year], 39 y.o.   MRN: [id number]  HPI  This adorable 39 yo M lady is here with 2 issues.  She is having BTB with the Mirena, especially a few hours after sex She is still having pain due to an abnormally healed perineal laceration.   Review of Systems     Objective:   Physical Exam  There is point tenderness at the abnormal skin area on her perineum. Her IUD string are visible      Assessment & Plan:  Vulvar dyspareunia- plan for excision of perineal skin in the OR DUB with Mirena- use an OCP prior to sex

## 2015-05-16 ENCOUNTER — Encounter (HOSPITAL_COMMUNITY): Payer: Self-pay | Admitting: *Deleted

## 2015-05-22 IMAGING — US US OB FOLLOW-UP
1 series · 12 of 28 positions shown · non-contrast
Comparison: none

[Series 1: us ob follow up · 12 of 40 slices shown]
[im 2/40]
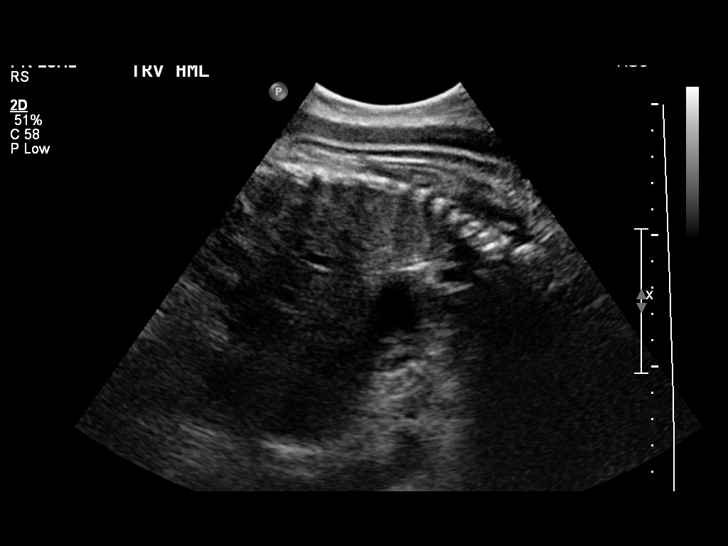
[im 5/40]
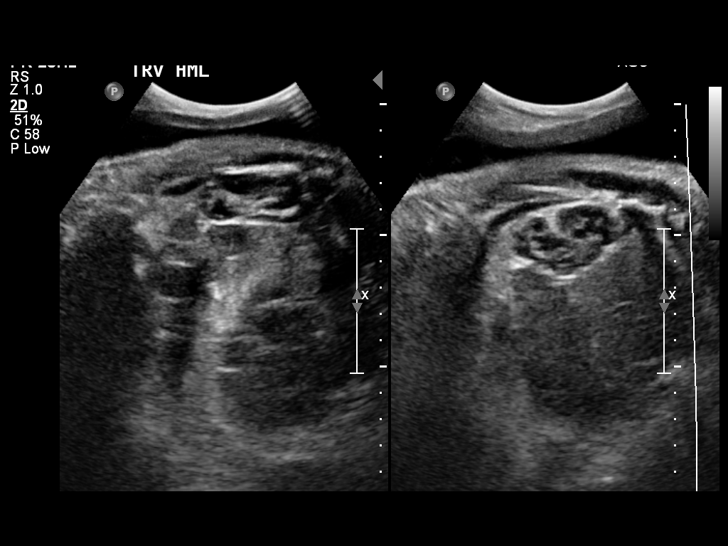
[im 8/40]
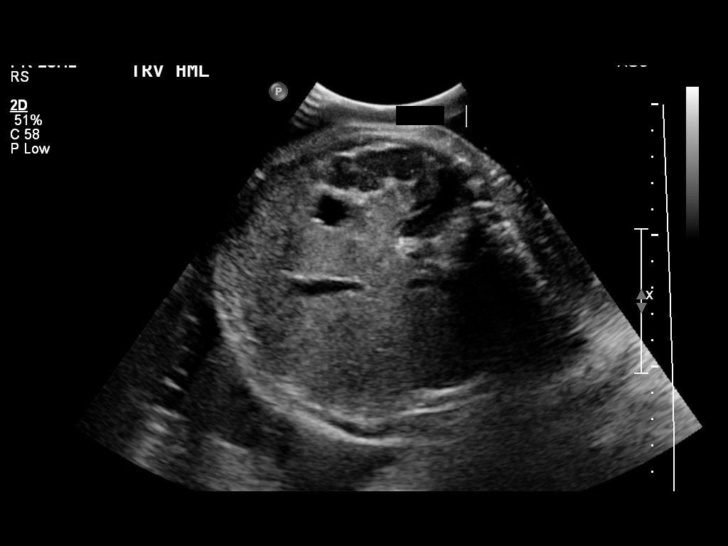
[im 12/40]
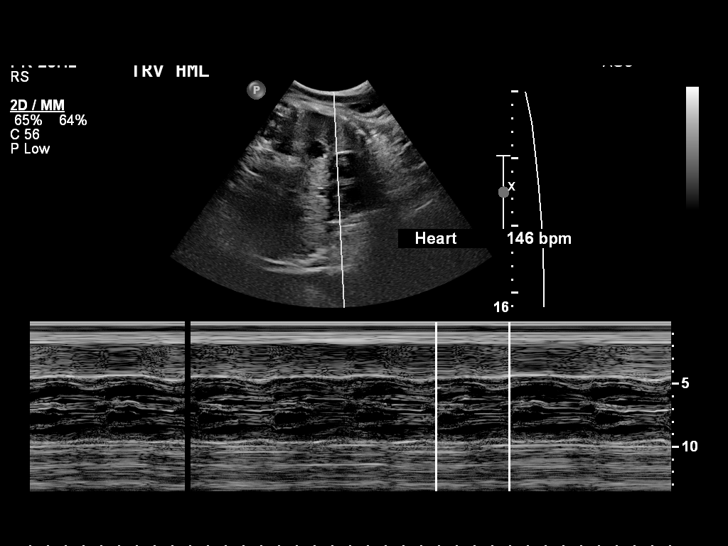
[im 15/40]
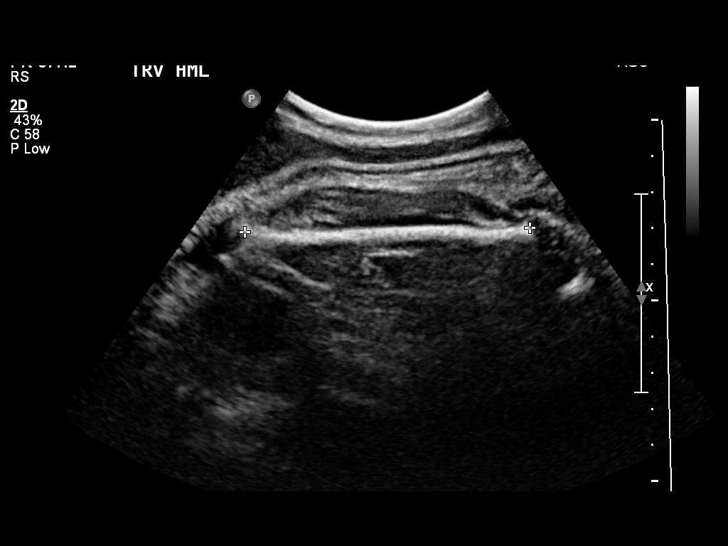
[im 18/40]
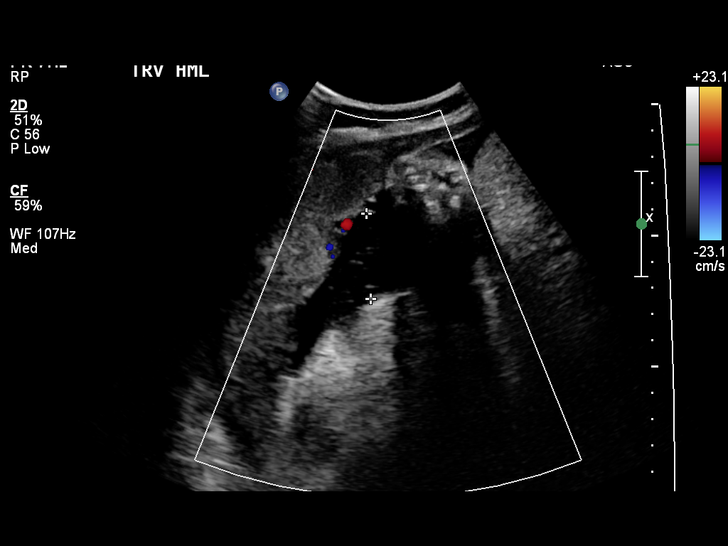
[im 22/40]
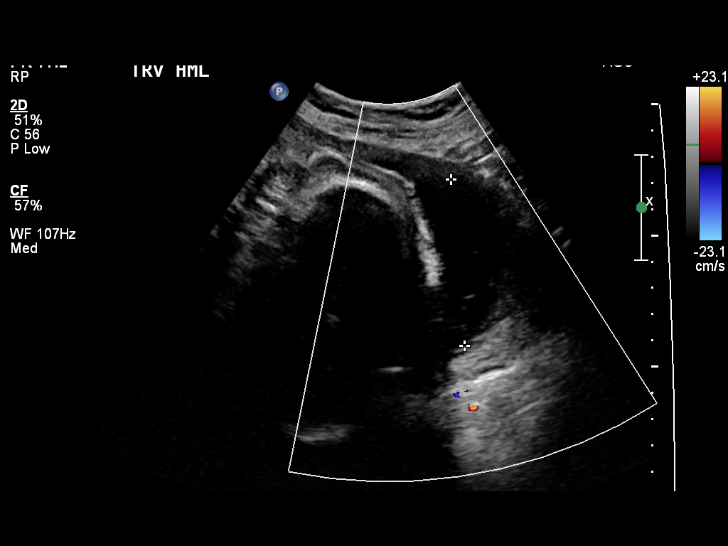
[im 25/40]
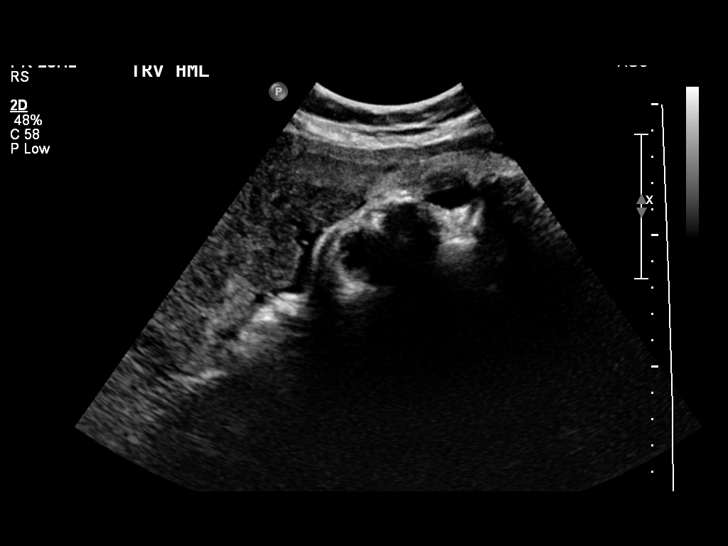
[im 28/40]
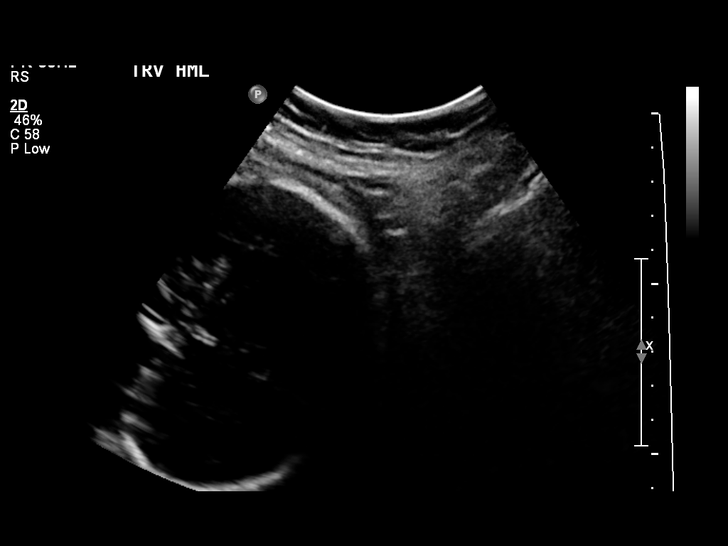
[im 32/40]
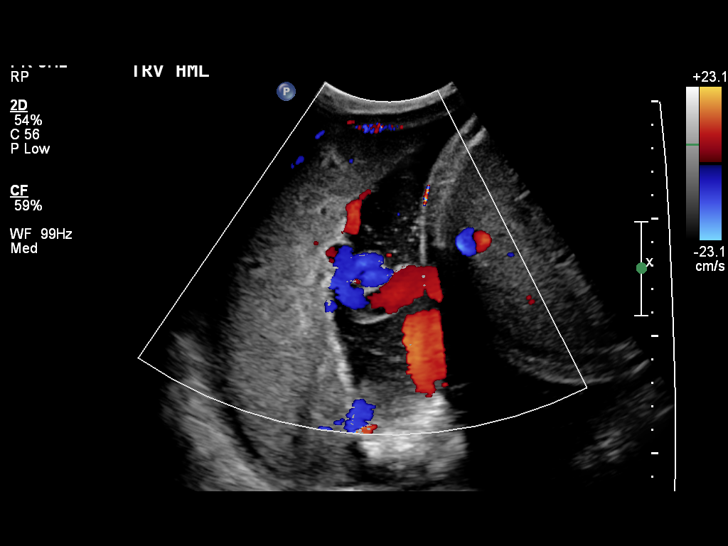
[im 35/40]
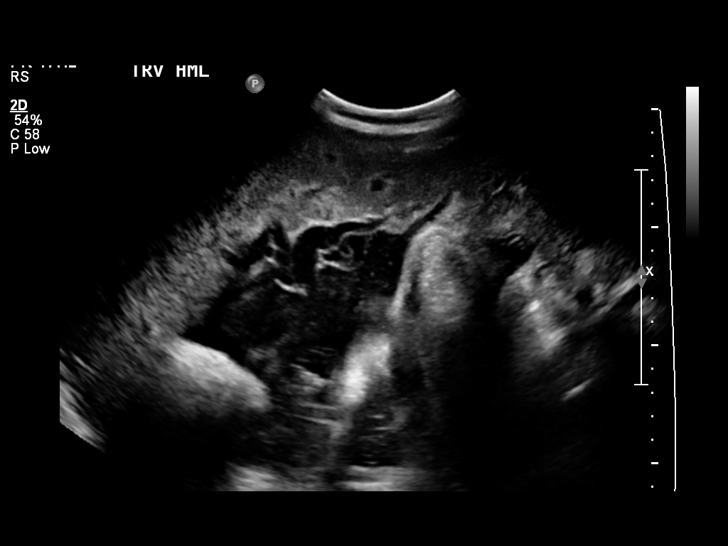
[im 38/40]
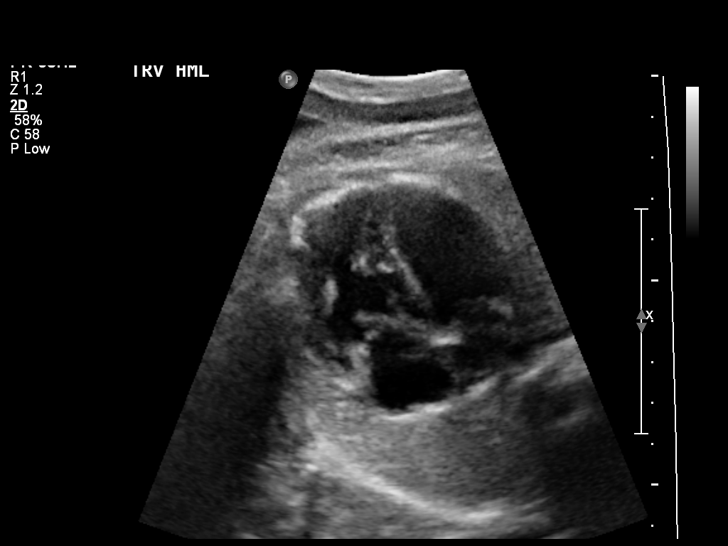

[12 of 28 positions shown; findings below may reference images not displayed]

OBSTETRICS REPORT
(Signed Final 12/06/2014 [DATE])

Service(s) Provided

US OB FOLLOW UP                                       76816.1
Indications

Diabetes - Gestational on glyburide
Poor obstetric history: Previous preeclampsia /
eclampsia/gestational HTN
Previous cesarean delivery
Advanced maternal age multigravida (38), second
trimester; declined testing
Obesity complicating pregnancy, third trimester
38 weeks gestation of pregnancy
Maternal care for excessive fetal growth, third
trimester, fetus unspecified
Fetal Evaluation

Num Of Fetuses:    1
Fetal Heart Rate:  146                          bpm
Cardiac Activity:  Observed
Presentation:      Transverse, head to
maternal left
Placenta:          Posterior, above cervical
os
P. Cord            Visualized, central
Insertion:

Amniotic Fluid
AFI FV:      Subjectively within normal limits
AFI Sum:     21.98   cm       91  %Tile     Larg Pckt:    7.04  cm
RUQ:   3.24    cm   RLQ:    7.04   cm    LUQ:   5.37    cm   LLQ:    6.33   cm
Biometry

BPD:     97.7  mm     G. Age:  40w 0d                CI:        74.97   70 - 86
FL/HC:      21.9   20.6 -
23.4
HC:       358  mm     G. Age:  N/A          94  %    HC/AC:      0.89   0.87 -
1.06
AC:     401.5  mm     G. Age:  N/A        > 97  %    FL/BPD:     80.1   71 - 87
FL:      78.3  mm     G. Age:  40w 0d       82  %    FL/AC:      19.5   20 - 24
HUM:     70.2  mm     G. Age:  N/A        > 95  %

Est. FW:    1332  gm    10 lb 9 oz    > 90  %
Gestational Age

LMP:           38w 6d        Date:  03/09/14                 EDD:   12/14/14
U/S Today:     40w 0d                                        EDD:   12/06/14
Best:          38w 6d     Det. By:  LMP  (03/09/14)          EDD:   12/14/14
Anatomy

Cranium:          Appears normal         Aortic Arch:      Previously seen
Fetal Cavum:      Appears normal         Ductal Arch:      Previously seen
Ventricles:       Previously seen        Diaphragm:        Appears normal
Choroid Plexus:   Previously seen        Stomach:          Appears normal, left
sided
Cerebellum:       Previously seen        Abdomen:          Previously seen
Posterior Fossa:  Previously seen        Abdominal Wall:   Previously seen
Nuchal Fold:      Not applicable (>20    Cord Vessels:     Previously seen
wks GA)
Face:             Appears normal         Kidneys:          Appear normal
(orbits and profile)
Lips:             Appears normal         Bladder:          Appears normal
Heart:            Appears normal         Spine:            Previously seen
(4CH, axis, and
situs)
RVOT:             Previously seen        Lower             Previously seen
Extremities:
LVOT:             Previously seen        Upper             Previously seen
Extremities:

Other:  Fetus appears to be a female. Heels and 5th digit previously
visualized.
Cervix Uterus Adnexa

Cervix:       Not visualized (advanced GA >94wks)
Uterus:       No abnormality visualized.
Cul De Sac:   No free fluid seen.
Left Ovary:    Not visualized.
Right Ovary:   Not visualized.
Adnexa:     No abnormality visualized.
Impression

SIUP at 38+6 weeks
Transverse presentation
Normal interval anatomy; anatomic survey complete
Normal amniotic fluid volume
EFW > 90th %tile; AC > 97th %tile; EFW 1332 grams or 10+9

Pt previously counseled re: LGA/macrosomic fetus; VBAC;
delivery injury; shoulder dystocia
Recommendations

Follow-up as clinically indicated

questions or concerns.

## 2015-06-15 ENCOUNTER — Encounter (HOSPITAL_COMMUNITY): Payer: Self-pay | Admitting: Anesthesiology

## 2015-06-15 ENCOUNTER — Ambulatory Visit (HOSPITAL_COMMUNITY): Payer: Medicaid Other | Admitting: Anesthesiology

## 2015-06-15 ENCOUNTER — Ambulatory Visit (HOSPITAL_COMMUNITY)
Admission: RE | Admit: 2015-06-15 | Discharge: 2015-06-15 | Disposition: A | Discharge status: Home / Self Care | Payer: Medicaid Other | Source: Ambulatory Visit | Attending: Obstetrics & Gynecology | Admitting: Obstetrics & Gynecology

## 2015-06-15 ENCOUNTER — Encounter (HOSPITAL_COMMUNITY)
Admission: RE | Disposition: A | Discharge status: Home / Self Care | Payer: Self-pay | Source: Ambulatory Visit | Attending: Obstetrics & Gynecology

## 2015-06-15 DIAGNOSIS — Z888 Allergy status to other drugs, medicaments and biological substances status: Secondary | ICD-10-CM | POA: Diagnosis not present

## 2015-06-15 DIAGNOSIS — F419 Anxiety disorder, unspecified: Secondary | ICD-10-CM | POA: Insufficient documentation

## 2015-06-15 DIAGNOSIS — L905 Scar conditions and fibrosis of skin: Secondary | ICD-10-CM | POA: Insufficient documentation

## 2015-06-15 DIAGNOSIS — Z6836 Body mass index (BMI) 36.0-36.9, adult: Secondary | ICD-10-CM | POA: Insufficient documentation

## 2015-06-15 DIAGNOSIS — N9419 Other specified dyspareunia: Secondary | ICD-10-CM | POA: Diagnosis not present

## 2015-06-15 DIAGNOSIS — K589 Irritable bowel syndrome without diarrhea: Secondary | ICD-10-CM | POA: Diagnosis not present

## 2015-06-15 DIAGNOSIS — N9089 Other specified noninflammatory disorders of vulva and perineum: Secondary | ICD-10-CM | POA: Insufficient documentation

## 2015-06-15 DIAGNOSIS — N9411 Superficial (introital) dyspareunia: Secondary | ICD-10-CM | POA: Diagnosis present

## 2015-06-15 DIAGNOSIS — N941 Unspecified dyspareunia: Secondary | ICD-10-CM | POA: Insufficient documentation

## 2015-06-15 DIAGNOSIS — E669 Obesity, unspecified: Secondary | ICD-10-CM | POA: Diagnosis not present

## 2015-06-15 HISTORY — PX: PERINEAL LACERATION REPAIR: SHX5389

## 2015-06-15 LAB — CBC
HCT: 38.3 % (ref 36.0–46.0)
Hemoglobin: 12.7 g/dL (ref 12.0–15.0)
MCH: 30.2 pg (ref 26.0–34.0)
MCHC: 33.2 g/dL (ref 30.0–36.0)
MCV: 91 fL (ref 78.0–100.0)
PLATELETS: 254 10*3/uL (ref 150–400)
RBC: 4.21 MIL/uL (ref 3.87–5.11)
RDW: 13.6 % (ref 11.5–15.5)
WBC: 5.7 10*3/uL (ref 4.0–10.5)

## 2015-06-15 LAB — PREGNANCY, URINE: PREG TEST UR: NEGATIVE

## 2015-06-15 SURGERY — SUTURE REPAIR, LACERATION, PERINEUM
Anesthesia: General | Site: Vulva

## 2015-06-15 MED ORDER — MIDAZOLAM HCL 2 MG/2ML IJ SOLN
INTRAMUSCULAR | Status: DC | PRN
  Administered 2015-06-15: 2 mg via INTRAVENOUS

## 2015-06-15 MED ORDER — LACTATED RINGERS IV SOLN
INTRAVENOUS | Status: DC
  Administered 2015-06-15: 13:00:00 via INTRAVENOUS

## 2015-06-15 MED ORDER — BUPIVACAINE HCL (PF) 0.5 % IJ SOLN
INTRAMUSCULAR | Status: DC | PRN
  Administered 2015-06-15: 5 mL

## 2015-06-15 MED ORDER — SCOPOLAMINE 1 MG/3DAYS TD PT72
1.0000 | MEDICATED_PATCH | Freq: Once | TRANSDERMAL | Status: DC
  Administered 2015-06-15: 1.5 mg via TRANSDERMAL

## 2015-06-15 MED ORDER — MEPERIDINE HCL 25 MG/ML IJ SOLN: 6.2500 mg | INTRAMUSCULAR | Status: DC | PRN

## 2015-06-15 MED ORDER — LIDOCAINE HCL (CARDIAC) 20 MG/ML IV SOLN
INTRAVENOUS | Status: DC | PRN
  Administered 2015-06-15: 30 mg via INTRAVENOUS
  Administered 2015-06-15: 70 mg via INTRAVENOUS

## 2015-06-15 MED ORDER — FENTANYL CITRATE (PF) 100 MCG/2ML IJ SOLN
25.0000 ug | INTRAMUSCULAR | Status: DC | PRN
  Administered 2015-06-15 (×2): 50 ug via INTRAVENOUS

## 2015-06-15 MED ORDER — PROPOFOL 10 MG/ML IV BOLUS
INTRAVENOUS | Status: AC
  Filled 2015-06-15: qty 20

## 2015-06-15 MED ORDER — KETOROLAC TROMETHAMINE 30 MG/ML IJ SOLN
INTRAMUSCULAR | Status: DC | PRN
  Administered 2015-06-15: 30 mg via INTRAVENOUS

## 2015-06-15 MED ORDER — KETOROLAC TROMETHAMINE 30 MG/ML IJ SOLN
INTRAMUSCULAR | Status: AC
  Filled 2015-06-15: qty 1

## 2015-06-15 MED ORDER — DEXAMETHASONE SODIUM PHOSPHATE 10 MG/ML IJ SOLN
INTRAMUSCULAR | Status: DC | PRN
  Administered 2015-06-15: 4 mg via INTRAVENOUS

## 2015-06-15 MED ORDER — OXYCODONE-ACETAMINOPHEN 5-325 MG PO TABS
ORAL_TABLET | ORAL | Status: AC
  Filled 2015-06-15: qty 1

## 2015-06-15 MED ORDER — PROPOFOL 10 MG/ML IV BOLUS
INTRAVENOUS | Status: DC | PRN
  Administered 2015-06-15: 200 mg via INTRAVENOUS

## 2015-06-15 MED ORDER — ONDANSETRON HCL 4 MG/2ML IJ SOLN
INTRAMUSCULAR | Status: DC | PRN
  Administered 2015-06-15: 4 mg via INTRAVENOUS

## 2015-06-15 MED ORDER — MIDAZOLAM HCL 2 MG/2ML IJ SOLN
INTRAMUSCULAR | Status: AC
  Filled 2015-06-15: qty 4

## 2015-06-15 MED ORDER — LIDOCAINE HCL (CARDIAC) 20 MG/ML IV SOLN
INTRAVENOUS | Status: AC
  Filled 2015-06-15: qty 5

## 2015-06-15 MED ORDER — FENTANYL CITRATE (PF) 100 MCG/2ML IJ SOLN
INTRAMUSCULAR | Status: AC
  Filled 2015-06-15: qty 4

## 2015-06-15 MED ORDER — SCOPOLAMINE 1 MG/3DAYS TD PT72
MEDICATED_PATCH | TRANSDERMAL | Status: AC
  Administered 2015-06-15: 1.5 mg via TRANSDERMAL
  Filled 2015-06-15: qty 1

## 2015-06-15 MED ORDER — ONDANSETRON HCL 4 MG/2ML IJ SOLN
INTRAMUSCULAR | Status: AC
  Filled 2015-06-15: qty 2

## 2015-06-15 MED ORDER — FENTANYL CITRATE (PF) 100 MCG/2ML IJ SOLN
INTRAMUSCULAR | Status: AC
  Filled 2015-06-15: qty 2

## 2015-06-15 MED ORDER — DEXAMETHASONE SODIUM PHOSPHATE 4 MG/ML IJ SOLN
INTRAMUSCULAR | Status: AC
  Filled 2015-06-15: qty 1

## 2015-06-15 MED ORDER — FENTANYL CITRATE (PF) 100 MCG/2ML IJ SOLN
INTRAMUSCULAR | Status: DC | PRN
  Administered 2015-06-15 (×2): 50 ug via INTRAVENOUS

## 2015-06-15 MED ORDER — OXYCODONE-ACETAMINOPHEN 5-325 MG PO TABS: 1.0000 | ORAL_TABLET | Freq: Four times a day (QID) | ORAL | Status: DC | PRN

## 2015-06-15 MED ORDER — LIDOCAINE HCL 1 % IJ SOLN
INTRAMUSCULAR | Status: AC
  Filled 2015-06-15: qty 20

## 2015-06-15 MED ORDER — METOCLOPRAMIDE HCL 5 MG/ML IJ SOLN: 10.0000 mg | Freq: Once | INTRAMUSCULAR | Status: DC | PRN

## 2015-06-15 MED ORDER — BUPIVACAINE HCL (PF) 0.5 % IJ SOLN
INTRAMUSCULAR | Status: AC
  Filled 2015-06-15: qty 30

## 2015-06-15 MED ORDER — OXYCODONE-ACETAMINOPHEN 5-325 MG PO TABS
1.0000 | ORAL_TABLET | ORAL | Status: DC | PRN
Start: 2015-06-15 — End: 2015-06-15
  Administered 2015-06-15: 1 via ORAL

## 2015-06-15 SURGICAL SUPPLY — 21 items
CLOTH BEACON ORANGE TIMEOUT ST (SAFETY) ×2 IMPLANT
COUNTER NEEDLE 1200 MAGNETIC (NEEDLE) IMPLANT
GLOVE BIO SURGEON STRL SZ 6.5 (GLOVE) ×2 IMPLANT
GLOVE BIOGEL PI IND STRL 7.0 (GLOVE) ×1 IMPLANT
GLOVE BIOGEL PI INDICATOR 7.0 (GLOVE) ×1
GOWN STRL REUS W/TWL LRG LVL3 (GOWN DISPOSABLE) ×4 IMPLANT
NDL SPNL 22GX3.5 QUINCKE BK (NEEDLE) ×1 IMPLANT
NEEDLE SPNL 22GX3.5 QUINCKE BK (NEEDLE) ×2 IMPLANT
NS IRRIG 1000ML POUR BTL (IV SOLUTION) ×2 IMPLANT
PACK VAGINAL MINOR WOMEN LF (CUSTOM PROCEDURE TRAY) ×2 IMPLANT
PAD PREP 24X48 CUFFED NSTRL (MISCELLANEOUS) ×2 IMPLANT
SUT CHROMIC 4 0 SH 27 (SUTURE) IMPLANT
SUT VIC AB 3-0 CT1 27 (SUTURE) ×2
SUT VIC AB 3-0 CT1 TAPERPNT 27 (SUTURE) IMPLANT
SUT VIC AB 3-0 SH 27 (SUTURE)
SUT VIC AB 3-0 SH 27X BRD (SUTURE) IMPLANT
SUT VIC AB 4-0 SH 27 (SUTURE) ×2
SUT VIC AB 4-0 SH 27XANBCTRL (SUTURE) IMPLANT
SYR CONTROL 10ML LL (SYRINGE) ×2 IMPLANT
TOWEL OR 17X24 6PK STRL BLUE (TOWEL DISPOSABLE) ×4 IMPLANT
WATER STERILE IRR 1000ML POUR (IV SOLUTION) ×2 IMPLANT

## 2015-06-15 NOTE — Anesthesia Procedure Notes (Signed)
Procedure Name: LMA Insertion Date/Time: 06/15/2015 1:38 PM Performed by: Tobin Chad Pre-anesthesia Checklist: Patient identified, Timeout performed, Emergency Drugs available, Suction available and Patient being monitored Patient Re-evaluated:Patient Re-evaluated prior to inductionOxygen Delivery Method: Circle system utilized and Simple face mask Preoxygenation: Pre-oxygenation with 100% oxygen Intubation Type: IV induction Ventilation: Mask ventilation without difficulty LMA Size: 4.0 Grade View: Grade II Tube type: Oral Number of attempts: 1 Placement Confirmation: positive ETCO2 and CO2 detector Tube secured with: Tape Dental Injury: Teeth and Oropharynx as per pre-operative assessment

## 2015-06-15 NOTE — Anesthesia Postprocedure Evaluation (Signed)
  Anesthesia Post-op Note  Patient: Jenny Marshall  Procedure(s) Performed: Procedure(s) (LRB): Revision of Female Cirumcision and Perineal Scar Revision (N/A)  Patient Location: PACU  Anesthesia Type: General  Level of Consciousness: awake and alert   Airway and Oxygen Therapy: Patient Spontanous Breathing  Post-op Pain: mild  Post-op Assessment: Post-op Vital signs reviewed, Patient's Cardiovascular Status Stable, Respiratory Function Stable, Patent Airway and No signs of Nausea or vomiting  Last Vitals:  Filed Vitals:   06/15/15 1319  BP: 153/109  Pulse: 86  Temp: 36.6 C  Resp: 18    Post-op Vital Signs: stable   Complications: No apparent anesthesia complications

## 2015-06-15 NOTE — Anesthesia Preprocedure Evaluation (Addendum)
Anesthesia Evaluation  Patient identified by MRN, date of birth, ID band Patient awake    Reviewed: Allergy & Precautions, NPO status , Patient's Chart, lab work & pertinent test results  Airway Mallampati: III  TM Distance: >3 FB Neck ROM: Full    Dental no notable dental hx. (+) Teeth Intact   Pulmonary    Pulmonary exam normal breath sounds clear to auscultation       Cardiovascular + angina (hx of angina for many years, reports negative stress test, resports associated with anxiety, no recent symptoms) Normal cardiovascular exam Rhythm:Regular Rate:Normal  Hx/o PIH   Neuro/Psych Anxiety negative neurological ROS  negative psych ROS   GI/Hepatic negative GI ROS, Neg liver ROS,   Endo/Other  diabetes, Well Controlled, GestationalObesity  Renal/GU negative Renal ROS  negative genitourinary   Musculoskeletal negative musculoskeletal ROS (+)   Abdominal (+) + obese,   Peds  Hematology  (+) anemia ,   Anesthesia Other Findings   Reproductive/Obstetrics Dyspareunia DUB                           Anesthesia Physical Anesthesia Plan  ASA: II  Anesthesia Plan: General   Post-op Pain Management:    Induction: Intravenous  Airway Management Planned: LMA  Additional Equipment:   Intra-op Plan:   Post-operative Plan: Extubation in OR  Informed Consent: I have reviewed the patients History and Physical, chart, labs and discussed the procedure including the risks, benefits and alternatives for the proposed anesthesia with the patient or authorized representative who has indicated his/her understanding and acceptance.     Plan Discussed with: Anesthesiologist  Anesthesia Plan Comments: (Patient did not want a spinal anesthetic, prefers general)       Anesthesia Quick Evaluation

## 2015-06-15 NOTE — Transfer of Care (Signed)
Immediate Anesthesia Transfer of Care Note  Patient: Jenny Marshall  Procedure(s) Performed: Procedure(s) with comments: Revision of Female Cirumcision and Perineal Scar Revision (N/A) - Requested 06/15/15 @ 1:30p  Patient Location: PACU  Anesthesia Type:General  Level of Consciousness: awake, alert , oriented and patient cooperative  Airway & Oxygen Therapy: Patient Spontanous Breathing and Patient connected to nasal cannula oxygen  Post-op Assessment: Report given to RN and Post -op Vital signs reviewed and stable  Post vital signs: Reviewed and stable  Last Vitals:  Filed Vitals:   06/15/15 1319  BP: 153/109  Pulse: 86  Temp: 36.6 C  Resp: 18    Complications: No apparent anesthesia complications

## 2015-06-15 NOTE — Discharge Instructions (Signed)
Nothing per vagina for 6 weeksDISCHARGE INSTRUCTIONS: D&C / D&E The following instructions have been prepared to help you care for yourself upon your return home.   Personal hygiene:  Use sanitary pads for vaginal drainage, not tampons.  Shower the day after your procedure.  NO tub baths, pools or Jacuzzis for 2-3 weeks.  Wipe front to back after using the bathroom.  Activity and limitations:  Do NOT drive or operate any equipment for 24 hours. The effects of anesthesia are still present and drowsiness may result.  Do NOT rest in bed all day.  Walking is encouraged.  Walk up and down stairs slowly.  You may resume your normal activity in one to two days or as indicated by your physician.  Sexual activity: NO intercourse for at least 2 weeks after the procedure, or as indicated by your physician.  Diet: Eat a light meal as desired this evening. You may resume your usual diet tomorrow.  Return to work: You may resume your work activities in one to two days or as indicated by your doctor.  What to expect after your surgery: Expect to have vaginal bleeding/discharge for 2-3 days and spotting for up to 10 days. It is not unusual to have soreness for up to 1-2 weeks. You may have a slight burning sensation when you urinate for the first day. Mild cramps may continue for a couple of days. You may have a regular period in 2-6 weeks.  Call your doctor for any of the following:  Excessive vaginal bleeding, saturating and changing one pad every hour.  Inability to urinate 6 hours after discharge from hospital.  Pain not relieved by pain medication.  Fever of 100.4 F or greater.  Unusual vaginal discharge or odor.   Call for an appointment:    Patients signature: ______________________  Nurses signature ________________________  Support person's signature_______________________

## 2015-06-15 NOTE — H&P (Addendum)
Jenny Marshall is an 39 y.o. female.  She is here today to have an excision of abnormally healed tissue of her perineum after a vaginal delivery 6 months ago. She finds it to be very painful, especially with sex. She also would like her female circumcision revised "Make it look like it's normal". This is because penetration is hard due to her labia being sewn together.    No LMP recorded.    Past Medical History  Diagnosis Date  . Shortness of breath     AGE 46-21; WITH STRESS  . Irritable bowel     X SEVERAL YEARS;  RESOLVED 2006  . Varicose veins   . Anginal pain ([place])     AGE 46-21; HAD EVAL; NO DX; OCCURS WITH STRSS ONT BLOOD THINNERS AGE 58  . Anemia     CHRONIC  . H/O varicella   . Low iron   . Obesity   . Gestational diabetes 2014    current pregnancy  . Pregnancy induced hypertension     Past Surgical History  Procedure Laterality Date  . Vulva surgery  at age 86    clitorectomy  . Cesarean section N/A 10/05/2012    Procedure: Primary cesarean section with delivery of baby boy 12. Apgars 8/9.;  Surgeon: Alwyn Pea, MD;  Location: [facility];  Service: Obstetrics;  Laterality: N/A;    Family History  Problem Relation Age of Onset  . Hypertension Father   . Hyperlipidemia Paternal Aunt   . Diabetes Paternal Aunt   . Hypertension Paternal Aunt   . Kidney disease Paternal Aunt   . Hyperlipidemia Paternal Uncle   . Diabetes Paternal Uncle   . Hypertension Paternal Uncle   . Stroke Paternal Uncle   . Rheum arthritis Paternal Uncle   . Arthritis Maternal Grandmother   . Diabetes Maternal Grandmother   . Hyperlipidemia Maternal Grandmother   . Other Maternal Grandmother     VARICOSE VEINS  . Heart disease Maternal Grandfather   . Vision loss Paternal Grandfather     GLAUCOMA    Social History:  reports that she has never smoked. She has never used smokeless tobacco. She reports that she does not drink alcohol or use illicit drugs.  Allergies:  Allergies   Allergen Reactions  . Prednisone Other (See Comments)    PRICKLY FEELING;     Prescriptions prior to admission  Medication Sig Dispense Refill Last Dose  . amoxicillin (AMOXIL) 500 MG capsule Take 500 mg by mouth 3 (three) times daily. 7 day course for dental procedure.   06/14/2015 at Unknown time  . ibuprofen (ADVIL,MOTRIN) 600 MG tablet Take 1 tablet (600 mg total) by mouth every 6 (six) hours. 30 tablet 0 Past Month at Unknown time  . lidocaine (XYLOCAINE) 2 % jelly Apply 1 application topically as needed. 30 mL 2   . Prenatal Vit-Fe Fumarate-FA (PRENATAL MULTIVITAMIN) TABS tablet Take 1 tablet by mouth 3 (three) times daily.   06/14/2015 at Unknown time    ROS  currently breastfeeding. Physical Exam  WNWHBFNAD Heart- rrr Lungs- CTAB Abd- benign The labia minora are sewn together.   No results found for this or any previous visit (from the past 24 hour(s)).  No results found.  Assessment/Plan: Abnormally healed perineal tissue- plan to excise this Dyspareunia with female circumcision - I will separate the labia minora as much as I can but she understands that entire separation may not be possible  Kajuan Guyton C. 06/15/2015, 1:00 PM

## 2015-06-15 NOTE — Op Note (Signed)
06/15/2015  2:15 PM  PATIENT:  Jenny Marshall  39 y.o. female  PRE-OPERATIVE DIAGNOSIS:  Dyspareunia, abnormally healed perineal tissue, female circumcision  POST-OPERATIVE DIAGNOSIS:  same  PROCEDURE:  Procedure(s) with comments: Revision of Female Cirumcision and Perineal Scar Revision (N/A) - Requested 06/15/15 @ 1:30p  SURGEON:  Surgeon(s) and Role:    * Emily Filbert, MD - Primary  PHYSICIAN ASSISTANT:   ANESTHESIA:   general  EBL:  Total I/O In: 700 [I.V.:700] Out: 100 [Urine:100]  BLOOD ADMINISTERED:none  DRAINS: none   LOCAL MEDICATIONS USED:  MARCAINE     SPECIMEN:  No Specimen  DISPOSITION OF SPECIMEN:  N/A  COUNTS:  YES  TOURNIQUET:  * No tourniquets in log *  DICTATION: .Dragon Dictation  PLAN OF CARE: Discharge to home after PACU  PATIENT DISPOSITION:  PACU - hemodynamically stable.   Delay start of Pharmacological VTE agent (>24hrs) due to surgical blood loss or risk of bleeding: not applicable  The risks, benefits, and alternatives of surgery were explained, understood, and accepted. Consent form was signed. In the OR, she was placed in dorsal lithotomy position and general anesthesia was given without complication. Her vagina was prepped and draped in the usual sterile fashion. Her bladder was emptied with a Robinson catheter. I injected 5 cc of 0.5% marcaine into the perineum and along the line where her labia minora should be separate. I used a #15 blade to excise the abnormal tissue on the perineum. I closed it with a 3-0 vicryl suture. Excellent cosmetic results were obtained. I then used the #15 to incise along what appeared to be the original separation of her labia minora. I used metzenbaum scissors and gentle manual traction to find the correct plane. I could feel her clitorus anterior/superior to my incision. I then sewed the edges of the newly formed labia minora with 4-0 vicrly suture. Excellent hemostasis was noted. She was extubated and taken  to the recovery room in stable condition.

## 2015-06-16 ENCOUNTER — Encounter (HOSPITAL_COMMUNITY): Payer: Self-pay | Admitting: Obstetrics & Gynecology

## 2015-08-14 ENCOUNTER — Ambulatory Visit (INDEPENDENT_AMBULATORY_CARE_PROVIDER_SITE_OTHER): Payer: Medicaid Other | Admitting: Obstetrics & Gynecology

## 2015-08-14 ENCOUNTER — Encounter: Payer: Self-pay | Admitting: Obstetrics & Gynecology

## 2015-08-14 VITALS — BP 129/89 | HR 91 | Resp 16 | Ht 69.0 in | Wt 247.0 lb

## 2015-08-14 DIAGNOSIS — E559 Vitamin D deficiency, unspecified: Secondary | ICD-10-CM

## 2015-08-14 DIAGNOSIS — R7989 Other specified abnormal findings of blood chemistry: Secondary | ICD-10-CM | POA: Diagnosis not present

## 2015-08-14 DIAGNOSIS — Z9889 Other specified postprocedural states: Secondary | ICD-10-CM

## 2015-08-14 MED ORDER — IBUPROFEN 600 MG PO TABS: 600.0000 mg | ORAL_TABLET | Freq: Four times a day (QID) | ORAL | Status: DC

## 2015-08-14 NOTE — Progress Notes (Signed)
   Subjective:    Patient ID: Jenny Marshall, female    DOB: 1975/12/25, 40 y.o.   MRN: [id number]  HPI  This M 40 yo lady is here 8 weeks post op s/p revision of female circumcision and removal of abnormal tissue from perineum (poorly healed obstetrical trauma). She had sex last week and DID NOT have pain. She is breastfeeding and has some vaginal dryness.  Review of Systems She had a normal pap smear 9/15 at [facility].    Objective:   Physical Exam WNWNBFNAD Breathing, conversing, and ambulating normally Well healed perineum and labia       Assessment & Plan:  Vaginal dryness- rec lubricant prn  H/O low serum vitamin D. She has taken the prescription vit d and is on a daily postnatal vitamin. I will recheck her level.

## 2015-08-14 NOTE — Addendum Note (Signed)
Addended by: Gretchen Short on: 08/14/2015 03:04 PM   Modules accepted: Orders

## 2015-08-15 LAB — CBC
HEMATOCRIT: 38.3 % (ref 36.0–46.0)
HEMOGLOBIN: 13.2 g/dL (ref 12.0–15.0)
MCH: 30.2 pg (ref 26.0–34.0)
MCHC: 34.5 g/dL (ref 30.0–36.0)
MCV: 87.6 fL (ref 78.0–100.0)
MPV: 11.5 fL (ref 8.6–12.4)
Platelets: 272 10*3/uL (ref 150–400)
RBC: 4.37 MIL/uL (ref 3.87–5.11)
RDW: 14 % (ref 11.5–15.5)
WBC: 6.5 10*3/uL (ref 4.0–10.5)

## 2015-08-15 LAB — VITAMIN D 25 HYDROXY (VIT D DEFICIENCY, FRACTURES): Vit D, 25-Hydroxy: 20 ng/mL — ABNORMAL LOW (ref 30–100)

## 2015-10-11 ENCOUNTER — Encounter: Payer: Self-pay | Admitting: Obstetrics & Gynecology

## 2015-10-11 ENCOUNTER — Ambulatory Visit (INDEPENDENT_AMBULATORY_CARE_PROVIDER_SITE_OTHER): Payer: Medicaid Other | Admitting: Obstetrics & Gynecology

## 2015-10-11 VITALS — BP 142/80 | HR 68 | Ht 69.0 in | Wt 246.0 lb

## 2015-10-11 DIAGNOSIS — E559 Vitamin D deficiency, unspecified: Secondary | ICD-10-CM

## 2015-10-11 DIAGNOSIS — Z30432 Encounter for removal of intrauterine contraceptive device: Secondary | ICD-10-CM

## 2015-10-11 MED ORDER — VITAMIN D (ERGOCALCIFEROL) 1.25 MG (50000 UNIT) PO CAPS: 50000.0000 [IU] | ORAL_CAPSULE | ORAL | Status: DC

## 2015-10-11 MED ORDER — FLUTICASONE PROPIONATE 50 MCG/ACT NA SUSP: 1.0000 | Freq: Every day | NASAL | Status: DC

## 2015-10-11 NOTE — Progress Notes (Signed)
   Subjective:    Patient ID: Jenny Marshall, female    DOB: 07-Oct-1975, 40 y.o.   MRN: [id number]  HPI  Jenny Marshall P4 here today to discuss her low Vitamin D. Jenny Marshall wants her Mirena removed. Jenny Marshall is frustrated by the irregular bleeding and Jenny Marshall is feeling very moody and attributes this to Mirena. Jenny Marshall plans to use condoms.  Review of Systems Jenny Marshall checks her BP at home and the SBP is usually in the 120s.    Objective:   Physical Exam WNWHFNAD Breathing, conversing, and ambulating normally Abd- obese, benign Mirena removed easily and noted to be intact       Assessment & Plan:  Low vit d- 50,000 units weekly. Recheck in 8 weeks Rec sun exposure. Contraception- condoms Allergies- re prescribe flonase

## 2016-06-10 ENCOUNTER — Encounter (HOSPITAL_COMMUNITY): Payer: Self-pay | Admitting: Emergency Medicine

## 2016-06-10 ENCOUNTER — Emergency Department (HOSPITAL_COMMUNITY)
Admission: EM | Admit: 2016-06-10 | Discharge: 2016-06-10 | Disposition: A | Discharge status: Home / Self Care | Payer: Medicaid Other | Attending: Physician Assistant | Admitting: Physician Assistant

## 2016-06-10 ENCOUNTER — Emergency Department (HOSPITAL_BASED_OUTPATIENT_CLINIC_OR_DEPARTMENT_OTHER)
Admit: 2016-06-10 | Discharge: 2016-06-10 | Disposition: A | Discharge status: Home / Self Care | Payer: Medicaid Other | Attending: Physician Assistant | Admitting: Physician Assistant

## 2016-06-10 DIAGNOSIS — M62838 Other muscle spasm: Secondary | ICD-10-CM

## 2016-06-10 LAB — CBC WITH DIFFERENTIAL/PLATELET
BASOS ABS: 0 10*3/uL (ref 0.0–0.1)
BASOS PCT: 1 %
Eosinophils Absolute: 0.1 10*3/uL (ref 0.0–0.7)
Eosinophils Relative: 2 %
HEMATOCRIT: 37 % (ref 36.0–46.0)
HEMOGLOBIN: 12.2 g/dL (ref 12.0–15.0)
Lymphocytes Relative: 40 %
Lymphs Abs: 1.9 10*3/uL (ref 0.7–4.0)
MCH: 29.8 pg (ref 26.0–34.0)
MCHC: 33 g/dL (ref 30.0–36.0)
MCV: 90.2 fL (ref 78.0–100.0)
MONOS PCT: 7 %
Monocytes Absolute: 0.4 10*3/uL (ref 0.1–1.0)
NEUTROS ABS: 2.5 10*3/uL (ref 1.7–7.7)
NEUTROS PCT: 50 %
Platelets: 263 10*3/uL (ref 150–400)
RBC: 4.1 MIL/uL (ref 3.87–5.11)
RDW: 13.2 % (ref 11.5–15.5)
WBC: 4.9 10*3/uL (ref 4.0–10.5)

## 2016-06-10 LAB — COMPREHENSIVE METABOLIC PANEL
ALBUMIN: 4 g/dL (ref 3.5–5.0)
ALK PHOS: 52 U/L (ref 38–126)
ALT: 15 U/L (ref 14–54)
AST: 22 U/L (ref 15–41)
Anion gap: 6 (ref 5–15)
BILIRUBIN TOTAL: 0.4 mg/dL (ref 0.3–1.2)
BUN: 15 mg/dL (ref 6–20)
CALCIUM: 9.1 mg/dL (ref 8.9–10.3)
CO2: 25 mmol/L (ref 22–32)
Chloride: 106 mmol/L (ref 101–111)
Creatinine, Ser: 0.57 mg/dL (ref 0.44–1.00)
GFR calc Af Amer: >60 mL/min (ref >60)
GFR calc non Af Amer: >60 mL/min (ref >60)
GLUCOSE: 138 mg/dL — AB (ref 65–99)
Potassium: 3.6 mmol/L (ref 3.5–5.1)
Sodium: 137 mmol/L (ref 135–145)
TOTAL PROTEIN: 8.4 g/dL — AB (ref 6.5–8.1)

## 2016-06-10 NOTE — ED Notes (Signed)
ED Provider at bedside. 

## 2016-06-10 NOTE — Discharge Instructions (Signed)
We are unsure what is causing her symptoms today. Your labs were all normal and your ultrasound showed no evidence of DVT. Please follow up with her primary care physician for further workup.

## 2016-06-10 NOTE — ED Provider Notes (Signed)
[facility] DEPT Provider Note   CSN: [id number] Arrival date & time: 06/10/16  0904     History   Chief Complaint Chief Complaint  Patient presents with  . Spasms    HPI Kerrington Fourman is a 40 y.o. female.  HPI   She is 40 year old female presenting with a constellation of symptoms. She reports that occasionally she has stabbing pains in her right calf that make her unable to move her right leg. She calls these spasms. She is has a very hard time describing what they are. She reports are both painful and therefore caused her to be unable to move. She says they are located in her right calf but it extends all the way up her leg. She denies any pain in her back. She states that there is pain in her calf and then in her hip but skips her thigh. Patient seen primary care physician about this and was diagnosed with spasms. However they've continued to happen. She feels like A more frequently. Occasionally they happen at rest. Occasionally happen while she is exerting herself.    Past Medical History:  Diagnosis Date  . Anemia    CHRONIC  . Anginal pain ([place])    AGE 21-21; HAD EVAL; NO DX; OCCURS WITH STRSS ONT BLOOD THINNERS AGE 55  . Gestational diabetes 2014   current pregnancy  . H/O varicella   . Irritable bowel    X SEVERAL YEARS;  RESOLVED 2006  . Low iron   . Obesity   . Pregnancy induced hypertension   . Shortness of breath    AGE 21-21; WITH STRESS  . Varicose veins     Patient Active Problem List   Diagnosis Date Noted  . Introital dyspareunia 06/15/2015  . Gestational hypertension, antepartum 12/06/2014  . Uterine scar from previous cesarean delivery, antepartum   . Fetal macrosomia   . Suspected macroscopic fetus 10/20/2014  . Obesity affecting pregnancy in third trimester, antepartum   . GDM (gestational diabetes mellitus)   . History of macrosomia in infant in prior pregnancy, currently pregnant in second trimester 07/20/2014  . Hx of preeclampsia, prior  pregnancy, currently pregnant 06/20/2014  . H/O gestational diabetes in prior pregnancy, currently pregnant 06/20/2014  . Elevated glycated hemoglobin 05/04/2014  . Advanced maternal age in multigravida 04/26/2014  . H/O previous obstetrical problem 04/26/2014  . High-risk pregnancy 04/26/2014  . H/O cesarean section 04/26/2014    Past Surgical History:  Procedure Laterality Date  . CESAREAN SECTION N/A 10/05/2012   Procedure: Primary cesarean section with delivery of baby boy 62. Apgars 8/9.;  Surgeon: Alwyn Pea, MD;  Location: [facility];  Service: Obstetrics;  Laterality: N/A;  . PERINEAL LACERATION REPAIR N/A 06/15/2015   Procedure: Revision of Female Cirumcision and Perineal Scar Revision;  Surgeon: Emily Filbert, MD;  Location: [facility];  Service: Gynecology;  Laterality: N/A;  Requested 06/15/15 @ 1:30p  . VULVA SURGERY  at age 26   clitorectomy    OB History    Gravida Para Term Preterm AB Living   4 4 4     4    SAB TAB Ectopic Multiple Live Births         0 4      Obstetric Comments   States last baby turned breech at the last minute. They had suspected he was 10lb.       Home Medications    Prior to Admission medications   Medication Sig Start Date End Date Taking?  Authorizing Provider  fluticasone (FLONASE) 50 MCG/ACT nasal spray Place 1 spray into both nostrils daily. Patient taking differently: Place 1 spray into both nostrils daily as needed for rhinitis.  10/11/15  Yes Emily Filbert, MD  HYDROcodone-acetaminophen (NORCO/VICODIN) 5-325 MG tablet Take 1 tablet by mouth every 6 (six) hours as needed for moderate pain.   Yes Historical Provider, MD  ibuprofen (ADVIL,MOTRIN) 600 MG tablet Take 1 tablet (600 mg total) by mouth every 6 (six) hours. Patient taking differently: Take 600 mg by mouth every 6 (six) hours as needed (Pain).  08/14/15  Yes Myra Marijo Sanes, MD  Olopatadine HCl (PATADAY) 0.2 % SOLN Apply 1 drop to eye daily as needed (Allergies).   Yes Historical Provider,  MD  penicillin v potassium (VEETID) 500 MG tablet Take 500 mg by mouth 4 (four) times daily.   Yes Historical Provider, MD  Prenatal Vit-Fe Fumarate-FA (PRENATAL MULTIVITAMIN) TABS tablet Take 1 tablet by mouth 3 (three) times daily.   Yes Historical Provider, MD  Vitamin D, Ergocalciferol, (DRISDOL) 50000 units CAPS capsule Take 1 capsule (50,000 Units total) by mouth every 7 (seven) days. 10/11/15  Yes Emily Filbert, MD    Family History Family History  Problem Relation Age of Onset  . Hypertension Father   . Hyperlipidemia Paternal Aunt   . Diabetes Paternal Aunt   . Hypertension Paternal Aunt   . Kidney disease Paternal Aunt   . Hyperlipidemia Paternal Uncle   . Diabetes Paternal Uncle   . Hypertension Paternal Uncle   . Stroke Paternal Uncle   . Rheum arthritis Paternal Uncle   . Arthritis Maternal Grandmother   . Diabetes Maternal Grandmother   . Hyperlipidemia Maternal Grandmother   . Other Maternal Grandmother     VARICOSE VEINS  . Heart disease Maternal Grandfather   . Vision loss Paternal Grandfather     GLAUCOMA    Social History Social History  Substance Use Topics  . Smoking status: Never Smoker  . Smokeless tobacco: Never Used  . Alcohol use No     Allergies   Prednisone   Review of Systems Review of Systems  Constitutional: Negative for fatigue.  Musculoskeletal: Positive for myalgias. Negative for back pain, gait problem, joint swelling and neck pain.  Neurological: Negative for weakness, light-headedness and numbness.  All other systems reviewed and are negative.    Physical Exam Updated Vital Signs BP 152/100   Pulse 79   Temp 97.9 F (36.6 C) (Oral)   Resp 16   SpO2 100%   Physical Exam  Constitutional: She is oriented to person, place, and time. She appears well-developed and well-nourished.  HENT:  Head: Normocephalic and atraumatic.  Eyes: Right eye exhibits no discharge.  Cardiovascular: Normal rate, regular rhythm and normal heart  sounds.   No murmur heard. Pulmonary/Chest: Effort normal and breath sounds normal. She has no wheezes. She has no rales.  Abdominal: Soft. She exhibits no distension. There is no tenderness.  Musculoskeletal:  Varicose veins bilaterally. Mild tenderness to the right calf on palpation. Calves are Equal in size.  Neurological: She is oriented to person, place, and time.  Good strength in plantar and dorsiflexion. Able to walk normally.  Skin: Skin is warm and dry. She is not diaphoretic.  Psychiatric: She has a normal mood and affect.  Nursing note and vitals reviewed.    ED Treatments / Results  Labs (all labs ordered are listed, but only abnormal results are displayed) Labs Reviewed  COMPREHENSIVE METABOLIC  PANEL - Abnormal; Notable for the following:       Result Value   Glucose, Bld 138 (*)    Total Protein 8.4 (*)    All other components within normal limits  CBC WITH DIFFERENTIAL/PLATELET    EKG  EKG Interpretation None       Radiology No results found.  Procedures Procedures (including critical care time)  Medications Ordered in ED Medications - No data to display   Initial Impression / Assessment and Plan / ED Course  I have reviewed the triage vital signs and the nursing notes.  Pertinent labs & imaging results that were available during my care of the patient were reviewed by me and considered in my medical decision making (see chart for details).  Clinical Course     Patient is a 40 year old female presenting with difficult to describe symptoms. I am unsure what is causing the patient's symptoms at this time. They sound as if they are spasms however she also reports weakness. However is intermittent. She is not having symptoms right now. We will rule out any dangerous pathology by getting labs to make she does not have hypokalemia. Addition we'll do an ultrasound of her labs to make sure she does not have a DVT. Aside from that I will defer further workup  to the patient's primary care physician as I don't have any other explanation for the symptoms.  Labs and [place] negative.  Patient is comfortable, ambulatory, and taking PO at time of discharge.  Patient expressed understanding about return precautions.    Final Clinical Impressions(s) / ED Diagnoses   Final diagnoses:  Muscle spasm    New Prescriptions Discharge Medication List as of 06/10/2016 12:00 PM       Jathan Balling Lyn Paublo Warshawsky, MD 06/10/16 1541

## 2016-06-10 NOTE — Progress Notes (Signed)
*  PRELIMINARY RESULTS* Vascular Ultrasound Right lower extremity venous duplex has been completed.  Preliminary findings: No evidence of DVT or baker's cyst.  Landry Mellow, RDMS, RVT  06/10/2016, 11:34 AM

## 2016-06-10 NOTE — ED Triage Notes (Signed)
Pt c/o muscle spasms x 3 weeks in R calf and upper leg. Pt sts that she will be completely fine then the spasm hits and she can't do anything and has even started to fall because of it. Pt denies numbness and tingling. Pt c/o 2/10 pain at the moment. Denies associated back pain but does have hx of back pain. A&Ox4 and ambulatory.

## 2016-08-05 ENCOUNTER — Encounter (HOSPITAL_COMMUNITY)
Admission: EM | Disposition: A | Discharge status: Other Acute Inpatient Hospital | Payer: Self-pay | Source: Home / Self Care | Attending: Emergency Medicine

## 2016-08-05 ENCOUNTER — Encounter (HOSPITAL_COMMUNITY): Payer: Self-pay | Admitting: Oncology

## 2016-08-05 ENCOUNTER — Emergency Department (HOSPITAL_COMMUNITY): Payer: Medicaid Other | Admitting: Certified Registered Nurse Anesthetist

## 2016-08-05 ENCOUNTER — Emergency Department (HOSPITAL_COMMUNITY)
Admission: EM | Admit: 2016-08-05 | Discharge: 2016-08-05 | Disposition: A | Discharge status: Other Acute Inpatient Hospital | Payer: Medicaid Other | Attending: Emergency Medicine | Admitting: Emergency Medicine

## 2016-08-05 DIAGNOSIS — K1379 Other lesions of oral mucosa: Secondary | ICD-10-CM

## 2016-08-05 DIAGNOSIS — K148 Other diseases of tongue: Secondary | ICD-10-CM | POA: Diagnosis not present

## 2016-08-05 DIAGNOSIS — K91841 Postprocedural hemorrhage and hematoma of a digestive system organ or structure following other procedure: Secondary | ICD-10-CM | POA: Insufficient documentation

## 2016-08-05 DIAGNOSIS — Z85810 Personal history of malignant neoplasm of tongue: Secondary | ICD-10-CM | POA: Insufficient documentation

## 2016-08-05 DIAGNOSIS — S01502A Unspecified open wound of oral cavity, initial encounter: Secondary | ICD-10-CM

## 2016-08-05 HISTORY — DX: Malignant (primary) neoplasm, unspecified: C80.1

## 2016-08-05 HISTORY — PX: WOUND EXPLORATION: SHX6188

## 2016-08-05 LAB — COMPREHENSIVE METABOLIC PANEL
ALBUMIN: 4.1 g/dL (ref 3.5–5.0)
ALT: 41 U/L (ref 14–54)
AST: 29 U/L (ref 15–41)
Alkaline Phosphatase: 66 U/L (ref 38–126)
Anion gap: 9 (ref 5–15)
BILIRUBIN TOTAL: 0.7 mg/dL (ref 0.3–1.2)
BUN: 12 mg/dL (ref 6–20)
CHLORIDE: 103 mmol/L (ref 101–111)
CO2: 24 mmol/L (ref 22–32)
Calcium: 8.9 mg/dL (ref 8.9–10.3)
Creatinine, Ser: 0.56 mg/dL (ref 0.44–1.00)
GFR calc Af Amer: >60 mL/min (ref >60)
GFR calc non Af Amer: >60 mL/min (ref >60)
GLUCOSE: 151 mg/dL — AB (ref 65–99)
POTASSIUM: 3.5 mmol/L (ref 3.5–5.1)
Sodium: 136 mmol/L (ref 135–145)
TOTAL PROTEIN: 8.1 g/dL (ref 6.5–8.1)

## 2016-08-05 LAB — CBC WITH DIFFERENTIAL/PLATELET
BASOS ABS: 0 10*3/uL (ref 0.0–0.1)
BASOS PCT: 0 %
EOS ABS: 0.1 10*3/uL (ref 0.0–0.7)
EOS PCT: 2 %
HEMATOCRIT: 39.2 % (ref 36.0–46.0)
Hemoglobin: 13.1 g/dL (ref 12.0–15.0)
Lymphocytes Relative: 39 %
Lymphs Abs: 2.6 10*3/uL (ref 0.7–4.0)
MCH: 29.6 pg (ref 26.0–34.0)
MCHC: 33.4 g/dL (ref 30.0–36.0)
MCV: 88.5 fL (ref 78.0–100.0)
MONO ABS: 0.5 10*3/uL (ref 0.1–1.0)
MONOS PCT: 7 %
Neutro Abs: 3.4 10*3/uL (ref 1.7–7.7)
Neutrophils Relative %: 52 %
PLATELETS: 313 10*3/uL (ref 150–400)
RBC: 4.43 MIL/uL (ref 3.87–5.11)
RDW: 13.1 % (ref 11.5–15.5)
WBC: 6.6 10*3/uL (ref 4.0–10.5)

## 2016-08-05 LAB — PROTIME-INR
INR: 1.01
PROTHROMBIN TIME: 13.3 s (ref 11.4–15.2)

## 2016-08-05 LAB — TYPE AND SCREEN
ABO/RH(D): O POS
ANTIBODY SCREEN: NEGATIVE

## 2016-08-05 LAB — ABO/RH: ABO/RH(D): O POS

## 2016-08-05 SURGERY — WOUND EXPLORATION
Anesthesia: General | Site: Mouth

## 2016-08-05 MED ORDER — OXYCODONE HCL 5 MG/5ML PO SOLN
ORAL | Status: AC
  Filled 2016-08-05: qty 5

## 2016-08-05 MED ORDER — EPHEDRINE SULFATE-NACL 50-0.9 MG/10ML-% IV SOSY
PREFILLED_SYRINGE | INTRAVENOUS | Status: DC | PRN
  Administered 2016-08-05 (×2): 15 mg via INTRAVENOUS

## 2016-08-05 MED ORDER — FENTANYL CITRATE (PF) 100 MCG/2ML IJ SOLN
INTRAMUSCULAR | Status: DC | PRN
  Administered 2016-08-05: 100 ug via INTRAVENOUS
  Administered 2016-08-05 (×2): 50 ug via INTRAVENOUS

## 2016-08-05 MED ORDER — 0.9 % SODIUM CHLORIDE (POUR BTL) OPTIME
TOPICAL | Status: DC | PRN
  Administered 2016-08-05: 1000 mL

## 2016-08-05 MED ORDER — HYDROMORPHONE HCL 1 MG/ML IJ SOLN
0.2500 mg | INTRAMUSCULAR | Status: DC | PRN
  Administered 2016-08-05 (×2): 0.5 mg via INTRAVENOUS

## 2016-08-05 MED ORDER — SUCCINYLCHOLINE CHLORIDE 20 MG/ML IJ SOLN
INTRAMUSCULAR | Status: DC | PRN
  Administered 2016-08-05: 140 mg via INTRAVENOUS

## 2016-08-05 MED ORDER — LIDOCAINE HCL (CARDIAC) 20 MG/ML IV SOLN
INTRAVENOUS | Status: DC | PRN
  Administered 2016-08-05: 100 mg via INTRAVENOUS

## 2016-08-05 MED ORDER — PHENYLEPHRINE HCL 10 MG/ML IJ SOLN
INTRAMUSCULAR | Status: DC | PRN
  Administered 2016-08-05: 80 ug via INTRAVENOUS
  Administered 2016-08-05: 200 ug via INTRAVENOUS
  Administered 2016-08-05: 160 ug via INTRAVENOUS
  Administered 2016-08-05: 80 ug via INTRAVENOUS
  Administered 2016-08-05: 120 ug via INTRAVENOUS

## 2016-08-05 MED ORDER — DEXTROSE 5 % IV SOLN
INTRAVENOUS | Status: DC | PRN
  Administered 2016-08-05: 100 ug/min via INTRAVENOUS

## 2016-08-05 MED ORDER — TRANEXAMIC ACID 1000 MG/10ML IV SOLN
500.0000 mg | Freq: Once | INTRAVENOUS | Status: AC
  Administered 2016-08-05: 500 mg via TOPICAL
  Filled 2016-08-05: qty 10

## 2016-08-05 MED ORDER — EPHEDRINE SULFATE 50 MG/ML IJ SOLN
INTRAMUSCULAR | Status: DC | PRN
  Administered 2016-08-05: 10 mg via INTRAVENOUS

## 2016-08-05 MED ORDER — MIDAZOLAM HCL 5 MG/5ML IJ SOLN
INTRAMUSCULAR | Status: DC | PRN
  Administered 2016-08-05: 2 mg via INTRAVENOUS

## 2016-08-05 MED ORDER — LACTATED RINGERS IV SOLN
INTRAVENOUS | Status: DC | PRN
Start: 2016-08-05 — End: 2016-08-05
  Administered 2016-08-05: 08:00:00 via INTRAVENOUS

## 2016-08-05 MED ORDER — LACTATED RINGERS IV SOLN
INTRAVENOUS | Status: DC | PRN
  Administered 2016-08-05: 09:00:00 via INTRAVENOUS

## 2016-08-05 MED ORDER — OXYCODONE HCL 5 MG/5ML PO SOLN
5.0000 mg | Freq: Once | ORAL | Status: AC
  Administered 2016-08-05: 5 mg via ORAL

## 2016-08-05 MED ORDER — PROMETHAZINE HCL 25 MG/ML IJ SOLN: 6.2500 mg | INTRAMUSCULAR | Status: DC | PRN

## 2016-08-05 MED ORDER — DEXAMETHASONE SODIUM PHOSPHATE 4 MG/ML IJ SOLN
INTRAMUSCULAR | Status: DC | PRN
  Administered 2016-08-05: 10 mg via INTRAVENOUS

## 2016-08-05 MED ORDER — ONDANSETRON HCL 4 MG/2ML IJ SOLN
INTRAMUSCULAR | Status: DC | PRN
  Administered 2016-08-05: 4 mg via INTRAVENOUS

## 2016-08-05 MED ORDER — HYDROCODONE-ACETAMINOPHEN 7.5-325 MG/15ML PO SOLN: 10.0000 mL | ORAL | 0 refills | Status: DC | PRN

## 2016-08-05 MED ORDER — LIDOCAINE-EPINEPHRINE (PF) 2 %-1:200000 IJ SOLN: 20.0000 mL | Freq: Once | INTRAMUSCULAR | Status: DC

## 2016-08-05 MED ORDER — PROPOFOL 10 MG/ML IV BOLUS
INTRAVENOUS | Status: DC | PRN
  Administered 2016-08-05: 150 mg via INTRAVENOUS
  Administered 2016-08-05: 50 mg via INTRAVENOUS

## 2016-08-05 MED ORDER — HYDROMORPHONE HCL 1 MG/ML IJ SOLN
INTRAMUSCULAR | Status: AC
  Filled 2016-08-05: qty 1

## 2016-08-05 SURGICAL SUPPLY — 26 items
CANISTER SUCTION 2500CC (MISCELLANEOUS) ×2 IMPLANT
CLEANER TIP ELECTROSURG 2X2 (MISCELLANEOUS) ×2 IMPLANT
CORDS BIPOLAR (ELECTRODE) ×1 IMPLANT
COVER SURGICAL LIGHT HANDLE (MISCELLANEOUS) ×2 IMPLANT
DRAPE ORTHO SPLIT 87X125 STRL (DRAPES) ×2 IMPLANT
ELECT COATED BLADE 2.86 ST (ELECTRODE) ×2 IMPLANT
ELECT REM PT RETURN 9FT ADLT (ELECTROSURGICAL) ×2
ELECTRODE REM PT RTRN 9FT ADLT (ELECTROSURGICAL) ×1 IMPLANT
GAUZE SPONGE 4X4 16PLY XRAY LF (GAUZE/BANDAGES/DRESSINGS) ×1 IMPLANT
GLOVE BIOGEL M 7.0 STRL (GLOVE) ×3 IMPLANT
GOWN STRL REUS W/ TWL LRG LVL3 (GOWN DISPOSABLE) ×2 IMPLANT
GOWN STRL REUS W/TWL LRG LVL3 (GOWN DISPOSABLE) ×4
KIT BASIN OR (CUSTOM PROCEDURE TRAY) ×2 IMPLANT
KIT ROOM TURNOVER OR (KITS) ×2 IMPLANT
NS IRRIG 1000ML POUR BTL (IV SOLUTION) ×2 IMPLANT
PAD ARMBOARD 7.5X6 YLW CONV (MISCELLANEOUS) ×4 IMPLANT
PENCIL BUTTON HOLSTER BLD 10FT (ELECTRODE) ×2 IMPLANT
SUT CHROMIC 3 0 SH 27 (SUTURE) ×1 IMPLANT
SUT CHROMIC 5 0 P 3 (SUTURE) IMPLANT
SUT ETHILON 5 0 PS 2 18 (SUTURE) IMPLANT
SUT SILK 2 0 REEL (SUTURE) IMPLANT
SUT SILK 3 0 SH 30 (SUTURE) ×2 IMPLANT
SUT SILK 3 0 SH CR/8 (SUTURE) IMPLANT
SUT SILK 4 0 REEL (SUTURE) IMPLANT
SUT VIC AB 3-0 SH 18 (SUTURE) IMPLANT
TRAY ENT MC OR (CUSTOM PROCEDURE TRAY) ×2 IMPLANT

## 2016-08-05 NOTE — ED Triage Notes (Addendum)
Pt sent here from [facility] for uncontrolled bleeding of tongue.  Presently approx 800 ml of blood loss.

## 2016-08-05 NOTE — ED Triage Notes (Signed)
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  Pt states that last night at approximately 2230 she brushed her teeth and it began to bleed.  Pt is actively bleeding at this time.  100 ml's of blood in emesis bag since arrival.

## 2016-08-05 NOTE — Anesthesia Procedure Notes (Signed)
Procedure Name: Intubation Date/Time: 08/05/2016 8:43 AM Performed by: Garrison Columbus T Pre-anesthesia Checklist: Patient identified, Emergency Drugs available, Suction available and Patient being monitored Patient Re-evaluated:Patient Re-evaluated prior to inductionOxygen Delivery Method: Circle System Utilized Preoxygenation: Pre-oxygenation with 100% oxygen Intubation Type: IV induction, Rapid sequence and Cricoid Pressure applied Ventilation: Mask ventilation without difficulty Laryngoscope Size: Glidescope Grade View: Grade I Tube type: Oral Tube size: 7.5 mm Number of attempts: 1 Airway Equipment and Method: Stylet and Oral airway Placement Confirmation: ETT inserted through vocal cords under direct vision,  positive ETCO2 and breath sounds checked- equal and bilateral Secured at: 21 cm Tube secured with: Tape Dental Injury: Teeth and Oropharynx as per pre-operative assessment  Comments: Suctioned oropharynx prior to induction.

## 2016-08-05 NOTE — Transfer of Care (Signed)
Immediate Anesthesia Transfer of Care Note  Patient: Jenny Marshall  Procedure(s) Performed: Procedure(s): CONTROL OF TOUNGUE BLEED (N/A)  Patient Location: PACU  Anesthesia Type:General  Level of Consciousness: awake, alert , oriented and patient cooperative  Airway & Oxygen Therapy: Patient Spontanous Breathing and Patient connected to nasal cannula oxygen  Post-op Assessment: Report given to RN and Post -op Vital signs reviewed and stable  Post vital signs: Reviewed and stable  Last Vitals:  Vitals:   08/05/16 1018 08/05/16 1033  BP: 118/64 131/82  Pulse: (!) 106 (!) 105  Resp: 20 15  Temp: 36.1 C     Last Pain:  Vitals:   08/05/16 1037  TempSrc:   PainSc: 6       Patients Stated Pain Goal: 3 ([id number])  Complications: No apparent anesthesia complications and Patient re-intubated

## 2016-08-05 NOTE — ED Notes (Signed)
All belongings including earrings placed in pt belongings bag and given to husband.

## 2016-08-05 NOTE — Brief Op Note (Signed)
08/05/2016  10:12 AM  PATIENT:  Jenny Marshall  41 y.o. female  PRE-OPERATIVE DIAGNOSIS:  Status Post toungue bleed  POST-OPERATIVE DIAGNOSIS:  Status Post toungue bleed  PROCEDURE:  Procedure(s): CONTROL OF TOUNGUE BLEED (N/A)  SURGEON:  Surgeon(s) and Role:    * Jerrell Belfast, MD - Primary  PHYSICIAN ASSISTANT:   ASSISTANTS: none   ANESTHESIA:   general  EBL:  Total I/O In: -  Out: 80 [Blood:80]  BLOOD ADMINISTERED:none  DRAINS: none   LOCAL MEDICATIONS USED:  NONE  SPECIMEN:  No Specimen  DISPOSITION OF SPECIMEN:  N/A  COUNTS:  YES  TOURNIQUET:  * No tourniquets in log *  DICTATION: .Other Dictation: Dictation Number [id number]  PLAN OF CARE: Discharge to home after PACU  PATIENT DISPOSITION:  PACU - hemodynamically stable.   Delay start of Pharmacological VTE agent (>24hrs) due to surgical blood loss or risk of bleeding: not applicable

## 2016-08-05 NOTE — Anesthesia Preprocedure Evaluation (Signed)
Anesthesia Evaluation  Patient identified by MRN, date of birth, ID band Patient awake    Reviewed: Allergy & Precautions, NPO status , Patient's Chart, lab work & pertinent test results  Airway Mallampati: II  TM Distance: >3 FB Neck ROM: Full    Dental no notable dental hx.    Pulmonary neg pulmonary ROS,    Pulmonary exam normal breath sounds clear to auscultation       Cardiovascular hypertension, Normal cardiovascular exam Rhythm:Regular Rate:Normal     Neuro/Psych negative neurological ROS  negative psych ROS   GI/Hepatic negative GI ROS, Neg liver ROS,   Endo/Other  diabetesobesity  Renal/GU negative Renal ROS  negative genitourinary   Musculoskeletal negative musculoskeletal ROS (+)   Abdominal   Peds negative pediatric ROS (+)  Hematology negative hematology ROS (+)   Anesthesia Other Findings   Reproductive/Obstetrics negative OB ROS                             Anesthesia Physical Anesthesia Plan  ASA: II  Anesthesia Plan: General   Post-op Pain Management:    Induction: Intravenous  Airway Management Planned: Oral ETT  Additional Equipment:   Intra-op Plan:   Post-operative Plan: Extubation in OR  Informed Consent: I have reviewed the patients History and Physical, chart, labs and discussed the procedure including the risks, benefits and alternatives for the proposed anesthesia with the patient or authorized representative who has indicated his/her understanding and acceptance.   Dental advisory given  Plan Discussed with: CRNA and Surgeon  Anesthesia Plan Comments:         Anesthesia Quick Evaluation

## 2016-08-05 NOTE — ED Notes (Signed)
Pt is currently breastfeeding.

## 2016-08-05 NOTE — Op Note (Signed)
Jenny Marshall, Jenny Marshall NO.:  [id number]  MEDICAL RECORD NO.:  [id number]  LOCATION:  D35C                         FACILITY:  [facility]  PHYSICIAN:  Early Chars. Wilburn Cornelia, M.D.DATE OF BIRTH:  1976-04-03  DATE OF PROCEDURE:  08/05/2016 DATE OF DISCHARGE:                              OPERATIVE REPORT   PREOPERATIVE DIAGNOSES: 1. Postoperative tongue hemorrhage. 2. Status post excision of squamous cell carcinoma, left lateral     tongue (July 25, 2016).  POSTOPERATIVE DIAGNOSES: 1. Postoperative tongue hemorrhage. 2. Status post excision of squamous cell carcinoma, left lateral     tongue (July 25, 2016).  INDICATION FOR SURGERY: 1. Postoperative tongue hemorrhage. 2. Status post excision of squamous cell carcinoma, left lateral     tongue (July 25, 2016).  SURGICAL PROCEDURE:  Exploration and control of left tongue hemorrhage.  ANESTHESIA:  General endotracheal.  SURGEON:  Early Chars. Wilburn Cornelia, M.D.  COMPLICATIONS:  None.  ESTIMATED BLOOD LOSS:  Approximately 50 mL.  DISPOSITION:  The patient transferred from the operating room to the recovery room in stable condition.  BRIEF HISTORY:  The patient is a 41 year old African female who presented to the [facility] Emergency Department with acute hemorrhage from the left anterior tongue and oropharynx.  She had undergone previous cancer resection for a lateral tongue carcinoma performed in [place] on July 25, 2016.  The surgery included wide local excision of the lesion with primary closure and excisional biopsy of a left superior neck lymph node.  The patient was stable and doing well, tolerating liquid and soft oral diet.  She developed acute hemorrhage on the morning of August 05, 2016, and presented to the [facility] Emergency Department for evaluation.  Given the patient's history and findings, she was transferred from [facility] to [facility] for  management of her acute hemorrhage. She was seen preoperatively and the risks and benefits of the surgical procedure were discussed with the patient and her husband.  They understood and agreed with our plan for surgery, which is scheduled on an emergency basis at [place].  DESCRIPTION OF PROCEDURE:  The patient was brought to the operating room on August 05, 2016, and placed in a supine position on the operating table.  General endotracheal anesthesia was established without difficulty.  When the patient was adequately anesthetized, she was positioned on the operating table.  A surgical time-out was then performed with correct identification of the patient and the surgical procedure.  With the patient prepped, draped, and prepared, her oral cavity was examined.  The previous surgical site had dehisced and the patient had a 2 x 4 cm lateral tongue open ulcer with active arterial bleeding from the anterior aspect consistent with her previous surgery.  The patient's oral cavity was further inspected.  No other mass or lesion was noted. The patient's hemorrhage was then controlled with a combination of suture ligature and Bovie suction cautery at the margins.  The patient's lateral tongue wound was then closed with interrupted 3-0 chromic sutures in a horizontal mattressing fashion.  The patient's oral cavity and oropharynx were irrigated and suctioned.  There was no further bleeding.  An orogastric tube was passed and the stomach contents were aspirated.  The patient was then awakened from her anesthetic.  She was extubated and transferred from the operating room to the recovery room in stable condition.  There were no complications and blood loss was approximately 50 mL.          ______________________________ Early Chars. Wilburn Cornelia, M.D.     DLS/MEDQ  D:  [id number]  T:  08/05/2016  Job:  [id number]

## 2016-08-05 NOTE — ED Provider Notes (Signed)
[facility] DEPT Provider Note   CSN: [id number] Arrival date & time: 08/05/16  0411     History   Chief Complaint Chief Complaint  Patient presents with  . Oral Bleeding    HPI Jenny Marshall is a 41 y.o. female.  HPI Pt comes in with cc of tongue bleed. 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 Pt was brushing her teeth around 10:30 pm and started having bleeding. She is not on blood thinner or aspirin.     Past Medical History:  Diagnosis Date  . Anemia    CHRONIC  . Anginal pain ([place])    AGE 32-21; HAD EVAL; NO DX; OCCURS WITH STRSS ONT BLOOD THINNERS AGE 40  . Gestational diabetes 2014   current pregnancy  . H/O varicella   . Irritable bowel    X SEVERAL YEARS;  RESOLVED 2006  . Low iron   . Obesity   . Pregnancy induced hypertension   . Shortness of breath    AGE 32-21; WITH STRESS  . Varicose veins     Patient Active Problem List   Diagnosis Date Noted  . Introital dyspareunia 06/15/2015  . Gestational hypertension, antepartum 12/06/2014  . Uterine scar from previous cesarean delivery, antepartum   . Fetal macrosomia   . Suspected macroscopic fetus 10/20/2014  . Obesity affecting pregnancy in third trimester, antepartum   . GDM (gestational diabetes mellitus)   . History of macrosomia in infant in prior pregnancy, currently pregnant in second trimester 07/20/2014  . Hx of preeclampsia, prior pregnancy, currently pregnant 06/20/2014  . H/O gestational diabetes in prior pregnancy, currently pregnant 06/20/2014  . Elevated glycated hemoglobin 05/04/2014  . Advanced maternal age in multigravida 04/26/2014  . H/O previous obstetrical problem 04/26/2014  . High-risk pregnancy 04/26/2014  . H/O cesarean section 04/26/2014    Past Surgical History:  Procedure Laterality Date  . CESAREAN SECTION N/A 10/05/2012   Procedure:  Primary cesarean section with delivery of baby boy 82. Apgars 8/9.;  Surgeon: Alwyn Pea, MD;  Location: [facility];  Service: Obstetrics;  Laterality: N/A;  . PERINEAL LACERATION REPAIR N/A 06/15/2015   Procedure: Revision of Female Cirumcision and Perineal Scar Revision;  Surgeon: Emily Filbert, MD;  Location: [facility];  Service: Gynecology;  Laterality: N/A;  Requested 06/15/15 @ 1:30p  . VULVA SURGERY  at age 85   clitorectomy    OB History    Gravida Para Term Preterm AB Living   4 4 4     4    SAB TAB Ectopic Multiple Live Births         0 4      Obstetric Comments   States last baby turned breech at the last minute. They had suspected he was 10lb.       Home Medications    Prior to Admission medications   Medication Sig Start Date End Date Taking? Authorizing Provider  acetaminophen (TYLENOL) 500 MG tablet Take 1,000 mg by mouth every 6 (six) hours as needed for mild pain, moderate pain or headache.   Yes Historical Provider, MD  fluticasone (FLONASE) 50 MCG/ACT nasal spray Place 1 spray into both nostrils daily. Patient taking differently: Place 1 spray into both nostrils daily as needed for rhinitis.  10/11/15  Yes Myra Marijo Sanes, MD  Olopatadine HCl (PATADAY) 0.2 % SOLN Apply 1 drop to eye daily  as needed (Allergies).   Yes Historical Provider, MD  oxyCODONE (OXY IR/ROXICODONE) 5 MG immediate release tablet Take 5 mg by mouth every 4 (four) hours as needed for moderate pain or severe pain.   Yes Historical Provider, MD  Prenatal Vit-Fe Fumarate-FA (PRENATAL MULTIVITAMIN) TABS tablet Take 1 tablet by mouth 3 (three) times daily.   Yes Historical Provider, MD  Vitamin D, Ergocalciferol, (DRISDOL) 50000 units CAPS capsule Take 1 capsule (50,000 Units total) by mouth every 7 (seven) days. 10/11/15  Yes Myra Marijo Sanes, MD  ibuprofen (ADVIL,MOTRIN) 600 MG tablet Take 1 tablet (600 mg total) by mouth every 6 (six) hours. Patient not taking: Reported on 08/05/2016 08/14/15   Emily Filbert, MD     Family History Family History  Problem Relation Age of Onset  . Hypertension Father   . Hyperlipidemia Paternal Aunt   . Diabetes Paternal Aunt   . Hypertension Paternal Aunt   . Kidney disease Paternal Aunt   . Hyperlipidemia Paternal Uncle   . Diabetes Paternal Uncle   . Hypertension Paternal Uncle   . Stroke Paternal Uncle   . Rheum arthritis Paternal Uncle   . Arthritis Maternal Grandmother   . Diabetes Maternal Grandmother   . Hyperlipidemia Maternal Grandmother   . Other Maternal Grandmother     VARICOSE VEINS  . Heart disease Maternal Grandfather   . Vision loss Paternal Grandfather     GLAUCOMA    Social History Social History  Substance Use Topics  . Smoking status: Never Smoker  . Smokeless tobacco: Never Used  . Alcohol use No     Allergies   Prednisone   Review of Systems Review of Systems  Unable to perform ROS: Acuity of condition     Physical Exam Updated Vital Signs BP (!) 146/105 (BP Location: Left Arm)   Pulse 120   Resp 20   Ht 5\' 9"  (1.753 m)   Wt 240 lb (108.9 kg)   LMP 07/29/2016 (Approximate)   SpO2 100%   BMI 35.44 kg/m   Physical Exam  Constitutional: She is oriented to person, place, and time. She appears well-developed.  HENT:  Head: Normocephalic and atraumatic.  Heavy bleeding from the tongue. Hard to visualize the source. There is surgical wound L side front posterior tongue.  Eyes: EOM are normal.  Neck: Normal range of motion. Neck supple.  Cardiovascular: Normal rate.   Pulmonary/Chest: Effort normal. No stridor.  Abdominal: Bowel sounds are normal.  Neurological: She is alert and oriented to person, place, and time.  Skin: Skin is warm and dry.  Nursing note and vitals reviewed.         ED Treatments / Results  Labs (all labs ordered are listed, but only abnormal results are displayed) Labs Reviewed  COMPREHENSIVE METABOLIC PANEL - Abnormal; Notable for the following:       Result Value   Glucose,  Bld 151 (*)    All other components within normal limits  CBC WITH DIFFERENTIAL/PLATELET  PROTIME-INR  TYPE AND SCREEN  ABO/RH    EKG  EKG Interpretation None       Radiology No results found.  CRITICAL CARE Performed by: Varney Biles   Total critical care time: 50 minutes  Critical care time was exclusive of separately billable procedures and treating other patients.  Critical care was necessary to treat or prevent imminent or life-threatening deterioration.  Critical care was time spent personally by me on the following activities: development of treatment plan with patient  and/or surrogate as well as nursing, discussions with consultants, evaluation of patient's response to treatment, examination of patient, obtaining history from patient or surrogate, ordering and performing treatments and interventions, ordering and review of laboratory studies, ordering and review of radiographic studies, pulse oximetry and re-evaluation of patient's condition.   Procedures Procedures (including critical care time)  Medications Ordered in ED Medications  lidocaine-EPINEPHrine (XYLOCAINE W/EPI) 2 %-1:200000 (PF) injection 20 mL (not administered)  tranexamic acid (CYKLOKAPRON) injection 500 mg (500 mg Topical Given 08/05/16 0544)     Initial Impression / Assessment and Plan / ED Course  I have reviewed the triage vital signs and the nursing notes.  Pertinent labs & imaging results that were available during my care of the patient were reviewed by me and considered in my medical decision making (see chart for details).  Clinical Course as of Aug 05 620  Mon Aug 05, 2016  [id number] I have tried direct pressure, gauze packing and gauze soaked in TXA packing - bleeding has continued. Pt has lost about 500-600 cc of blood. Spoke with ENT, they want pt transferred [facility]. Protecting airway. Dr. Tyrone Nine accepting at [facility] ER.  [AN]    Clinical Course User Index [AN]  Varney Biles, MD    Pt has heavy tongue bleeding. We will apply TXA. We will keep the gauze and pressure for now. Likely will need stitches. Protecting airway while she is sitting upright  Final Clinical Impressions(s) / ED Diagnoses   Final diagnoses:  Tongue wound, initial encounter  Oral bleeding  Postoperative hemorrhage involving digestive system following non-digestive system procedure    New Prescriptions New Prescriptions   No medications on file     Varney Biles, MD 08/05/16 [phone]

## 2016-08-05 NOTE — OR Nursing (Signed)
Following start of procedure, anesthesia machine in OR 9 encountered an equipment failure.  Patient continuously monitored throughout by anesthesia and transferred safely from OR 9 to OR 7.  Once positioned, surgery resumed without difficulty.

## 2016-08-05 NOTE — Anesthesia Postprocedure Evaluation (Signed)
Anesthesia Post Note  Patient: Jenny Marshall  Procedure(s) Performed: Procedure(s) (LRB): CONTROL OF TOUNGUE BLEED (N/A)  Patient location during evaluation: PACU Anesthesia Type: General Level of consciousness: awake and alert Pain management: pain level controlled Vital Signs Assessment: post-procedure vital signs reviewed and stable Respiratory status: spontaneous breathing, nonlabored ventilation, respiratory function stable and patient connected to nasal cannula oxygen Cardiovascular status: blood pressure returned to baseline and stable Postop Assessment: no signs of nausea or vomiting Anesthetic complications: no       Last Vitals:  Vitals:   08/05/16 0801 08/05/16 1018  BP: 144/96 118/64  Pulse: (!) 126 (!) 106  Resp: 18 20  Temp: 36.9 C 36.1 C    Last Pain:  Vitals:   08/05/16 1026  TempSrc:   PainSc: 7                  Abby Stines S

## 2016-08-05 NOTE — H&P (Signed)
Jenny Marshall is an 41 y.o. female.   Chief Complaint: tongue hemhorrage HPI: s/p resection of ant tongue Ca on 07/25/16. Pt with acute bleeding from ant tongue  Past Medical History:  Diagnosis Date  . Anemia    CHRONIC  . Anginal pain ([place])    AGE 31-21; HAD EVAL; NO DX; OCCURS WITH STRSS ONT BLOOD THINNERS AGE 89  . Cancer ([place])    squamous cell carcinoma, tongue  . Gestational diabetes 2014   current pregnancy  . H/O varicella   . Irritable bowel    X SEVERAL YEARS;  RESOLVED 2006  . Low iron   . Obesity   . Pregnancy induced hypertension   . Shortness of breath    AGE 31-21; WITH STRESS  . Varicose veins     Past Surgical History:  Procedure Laterality Date  . CESAREAN SECTION N/A 10/05/2012   Procedure: Primary cesarean section with delivery of baby boy 50. Apgars 8/9.;  Surgeon: Alwyn Pea, MD;  Location: [facility];  Service: Obstetrics;  Laterality: N/A;  . PERINEAL LACERATION REPAIR N/A 06/15/2015   Procedure: Revision of Female Cirumcision and Perineal Scar Revision;  Surgeon: Emily Filbert, MD;  Location: [facility];  Service: Gynecology;  Laterality: N/A;  Requested 06/15/15 @ 1:30p  . VULVA SURGERY  at age 59   clitorectomy    Family History  Problem Relation Age of Onset  . Hypertension Father   . Hyperlipidemia Paternal Aunt   . Diabetes Paternal Aunt   . Hypertension Paternal Aunt   . Kidney disease Paternal Aunt   . Hyperlipidemia Paternal Uncle   . Diabetes Paternal Uncle   . Hypertension Paternal Uncle   . Stroke Paternal Uncle   . Rheum arthritis Paternal Uncle   . Arthritis Maternal Grandmother   . Diabetes Maternal Grandmother   . Hyperlipidemia Maternal Grandmother   . Other Maternal Grandmother     VARICOSE VEINS  . Heart disease Maternal Grandfather   . Vision loss Paternal Grandfather     GLAUCOMA   Social History:  reports that she has never smoked. She has never used smokeless tobacco. She reports that she does not drink alcohol or use  drugs.  Allergies:  Allergies  Allergen Reactions  . Prednisone Other (See Comments)    PRICKLY FEELING;      (Not in a hospital admission)  Results for orders placed or performed during the hospital encounter of 08/05/16 (from the past 48 hour(s))  CBC with Differential     Status: None   Collection Time: 08/05/16  4:34 AM  Result Value Ref Range   WBC 6.6 4.0 - 10.5 K/uL   RBC 4.43 3.87 - 5.11 MIL/uL   Hemoglobin 13.1 12.0 - 15.0 g/dL   HCT 39.2 36.0 - 46.0 %   MCV 88.5 78.0 - 100.0 fL   MCH 29.6 26.0 - 34.0 pg   MCHC 33.4 30.0 - 36.0 g/dL   RDW 13.1 11.5 - 15.5 %   Platelets 313 150 - 400 K/uL   Neutrophils Relative % 52 %   Neutro Abs 3.4 1.7 - 7.7 K/uL   Lymphocytes Relative 39 %   Lymphs Abs 2.6 0.7 - 4.0 K/uL   Monocytes Relative 7 %   Monocytes Absolute 0.5 0.1 - 1.0 K/uL   Eosinophils Relative 2 %   Eosinophils Absolute 0.1 0.0 - 0.7 K/uL   Basophils Relative 0 %   Basophils Absolute 0.0 0.0 - 0.1 K/uL  Comprehensive metabolic panel  Status: Abnormal   Collection Time: 08/05/16  4:34 AM  Result Value Ref Range   Sodium 136 135 - 145 mmol/L   Potassium 3.5 3.5 - 5.1 mmol/L   Chloride 103 101 - 111 mmol/L   CO2 24 22 - 32 mmol/L   Glucose, Bld 151 (H) 65 - 99 mg/dL   BUN 12 6 - 20 mg/dL   Creatinine, Ser 0.56 0.44 - 1.00 mg/dL   Calcium 8.9 8.9 - 10.3 mg/dL   Total Protein 8.1 6.5 - 8.1 g/dL   Albumin 4.1 3.5 - 5.0 g/dL   AST 29 15 - 41 U/L   ALT 41 14 - 54 U/L   Alkaline Phosphatase 66 38 - 126 U/L   Total Bilirubin 0.7 0.3 - 1.2 mg/dL   GFR calc non Af Amer >60 >60 mL/min   GFR calc Af Amer >60 >60 mL/min    Comment: (NOTE) The eGFR has been calculated using the CKD EPI equation. This calculation has not been validated in all clinical situations. eGFR's persistently <60 mL/min signify possible Chronic Kidney Disease.    Anion gap 9 5 - 15  Protime-INR     Status: None   Collection Time: 08/05/16  4:34 AM  Result Value Ref Range   Prothrombin  Time 13.3 11.4 - 15.2 seconds   INR 1.01   Type and screen [place]     Status: None   Collection Time: 08/05/16  4:34 AM  Result Value Ref Range   ABO/RH(D) O POS    Antibody Screen NEG    Sample Expiration 08/08/2016   ABO/Rh     Status: None   Collection Time: 08/05/16  4:36 AM  Result Value Ref Range   ABO/RH(D) O POS    No results found.  Review of Systems  Constitutional: Negative.   HENT: Negative.   Respiratory: Negative.   Cardiovascular: Negative.     Blood pressure 144/96, pulse (!) 126, temperature 98.4 F (36.9 C), temperature source Axillary, resp. rate 18, height '5\' 9"'  (1.753 m), weight 108.9 kg (240 lb), last menstrual period 07/29/2016, SpO2 100 %, currently breastfeeding. Physical Exam  HENT:  S/p resection of tongue Ca Active bleeding from ant tongue  Neck:  Left neck incision  Cardiovascular: Normal rate.   Respiratory: Effort normal.     Assessment/Plan Adm for OP exploration and control of hemmorhage  Andren Bethea, MD 08/05/2016, 8:26 AM

## 2016-08-06 ENCOUNTER — Encounter (HOSPITAL_COMMUNITY): Payer: Self-pay | Admitting: Otolaryngology

## 2017-01-06 NOTE — Anesthesia Postprocedure Evaluation (Signed)
Anesthesia Post Note  Patient: [name]  Procedure(s) Performed: Procedure(s) (LRB): CONTROL OF TOUNGUE BLEED (N/A)     Anesthesia Post Evaluation  Last Vitals:  Vitals:   08/05/16 1048 08/05/16 1103  BP: 128/86 129/89  Pulse: (!) 108 (!) 111  Resp: 17 15  Temp:      Last Pain:  Vitals:   08/05/16 1103  TempSrc:   PainSc: 3                  Bette Brienza S

## 2017-01-06 NOTE — Addendum Note (Signed)
Addendum  created 01/06/17 1049 by Myrtie Soman, MD   Sign clinical note

## 2019-07-05 ENCOUNTER — Other Ambulatory Visit: Payer: Self-pay

## 2019-07-05 DIAGNOSIS — Z20822 Contact with and (suspected) exposure to covid-19: Secondary | ICD-10-CM

## 2019-07-06 LAB — NOVEL CORONAVIRUS, NAA: SARS-CoV-2, NAA: DETECTED — AB

## 2019-07-07 ENCOUNTER — Telehealth: Payer: Self-pay | Admitting: Nurse Practitioner

## 2019-07-07 NOTE — Telephone Encounter (Signed)
Called to Discuss with patient about Covid symptoms and the use of bamlanivimab, a monoclonal antibody infusion for those with mild to moderate Covid symptoms and at a high risk of hospitalization.     Pt is qualified for this infusion at the [facility] infusion center due to co-morbid conditions and/or a member of an at-risk group.   Patient's current at-risk problem  is obesity   Patient does not qualify do to lack of symptoms. Patient reports that overall she is doing well.

## 2022-06-23 ENCOUNTER — Emergency Department (HOSPITAL_BASED_OUTPATIENT_CLINIC_OR_DEPARTMENT_OTHER)
Admission: EM | Admit: 2022-06-23 | Discharge: 2022-06-24 | Disposition: A | Discharge status: Home / Self Care | Payer: Commercial Managed Care - HMO | Attending: Emergency Medicine | Admitting: Emergency Medicine

## 2022-06-23 ENCOUNTER — Encounter (HOSPITAL_BASED_OUTPATIENT_CLINIC_OR_DEPARTMENT_OTHER): Payer: Self-pay

## 2022-06-23 ENCOUNTER — Emergency Department (HOSPITAL_BASED_OUTPATIENT_CLINIC_OR_DEPARTMENT_OTHER): Payer: Commercial Managed Care - HMO

## 2022-06-23 ENCOUNTER — Other Ambulatory Visit: Payer: Self-pay

## 2022-06-23 DIAGNOSIS — N3000 Acute cystitis without hematuria: Secondary | ICD-10-CM

## 2022-06-23 DIAGNOSIS — R200 Anesthesia of skin: Secondary | ICD-10-CM | POA: Diagnosis not present

## 2022-06-23 DIAGNOSIS — M5431 Sciatica, right side: Secondary | ICD-10-CM

## 2022-06-23 DIAGNOSIS — M545 Low back pain, unspecified: Secondary | ICD-10-CM | POA: Insufficient documentation

## 2022-06-23 DIAGNOSIS — R000 Tachycardia, unspecified: Secondary | ICD-10-CM | POA: Diagnosis not present

## 2022-06-23 DIAGNOSIS — Z85818 Personal history of malignant neoplasm of other sites of lip, oral cavity, and pharynx: Secondary | ICD-10-CM | POA: Insufficient documentation

## 2022-06-23 LAB — URINALYSIS, ROUTINE W REFLEX MICROSCOPIC
Bilirubin Urine: NEGATIVE
Glucose, UA: 100 mg/dL — AB
Hgb urine dipstick: NEGATIVE
Ketones, ur: >80 mg/dL — AB
Nitrite: NEGATIVE
Protein, ur: 30 mg/dL — AB
Specific Gravity, Urine: 1.031 — ABNORMAL HIGH (ref 1.005–1.030)
pH: 5.5 (ref 5.0–8.0)

## 2022-06-23 LAB — CBC WITH DIFFERENTIAL/PLATELET
Abs Immature Granulocytes: 0.01 10*3/uL (ref 0.00–0.07)
Basophils Absolute: 0 10*3/uL (ref 0.0–0.1)
Basophils Relative: 1 %
Eosinophils Absolute: 0.1 10*3/uL (ref 0.0–0.5)
Eosinophils Relative: 1 %
HCT: 43.5 % (ref 36.0–46.0)
Hemoglobin: 15 g/dL (ref 12.0–15.0)
Immature Granulocytes: 0 %
Lymphocytes Relative: 42 %
Lymphs Abs: 2.7 10*3/uL (ref 0.7–4.0)
MCH: 30.6 pg (ref 26.0–34.0)
MCHC: 34.5 g/dL (ref 30.0–36.0)
MCV: 88.8 fL (ref 80.0–100.0)
Monocytes Absolute: 0.6 10*3/uL (ref 0.1–1.0)
Monocytes Relative: 9 %
Neutro Abs: 3.1 10*3/uL (ref 1.7–7.7)
Neutrophils Relative %: 47 %
Platelets: 221 10*3/uL (ref 150–400)
RBC: 4.9 MIL/uL (ref 3.87–5.11)
RDW: 12.2 % (ref 11.5–15.5)
WBC: 6.5 10*3/uL (ref 4.0–10.5)
nRBC: 0 % (ref 0.0–0.2)

## 2022-06-23 LAB — PREGNANCY, URINE: Preg Test, Ur: NEGATIVE

## 2022-06-23 MED ORDER — SODIUM CHLORIDE 0.9 % IV BOLUS
1000.0000 mL | Freq: Once | INTRAVENOUS | Status: AC
  Administered 2022-06-23: 1000 mL via INTRAVENOUS

## 2022-06-23 MED ORDER — METHYLPREDNISOLONE 4 MG PO TBPK: ORAL_TABLET | ORAL | 0 refills | Status: DC

## 2022-06-23 MED ORDER — METHOCARBAMOL 500 MG PO TABS: 500.0000 mg | ORAL_TABLET | Freq: Three times a day (TID) | ORAL | 0 refills | Status: DC | PRN

## 2022-06-23 MED ORDER — SODIUM CHLORIDE 0.9 % IV SOLN
1.0000 g | Freq: Once | INTRAVENOUS | Status: AC
  Administered 2022-06-23: 1 g via INTRAVENOUS
  Filled 2022-06-23: qty 10

## 2022-06-23 MED ORDER — KETOROLAC TROMETHAMINE 30 MG/ML IJ SOLN
30.0000 mg | Freq: Once | INTRAMUSCULAR | Status: AC
  Administered 2022-06-23: 30 mg via INTRAMUSCULAR
  Filled 2022-06-23: qty 1

## 2022-06-23 MED ORDER — CEPHALEXIN 500 MG PO CAPS: 500.0000 mg | ORAL_CAPSULE | Freq: Three times a day (TID) | ORAL | 0 refills | Status: DC

## 2022-06-23 MED ORDER — METHOCARBAMOL 500 MG PO TABS
500.0000 mg | ORAL_TABLET | Freq: Once | ORAL | Status: AC
  Administered 2022-06-23: 500 mg via ORAL
  Filled 2022-06-23: qty 1

## 2022-06-23 NOTE — ED Provider Notes (Signed)
[facility]PT Provider Note   CSN: [id number] Arrival date & time: 06/23/22  1425     History  Chief Complaint  Patient presents with   Back Pain    Jenny Marshall is a 46 y.o. female.  Patient with right-sided low back pain that radiate down her right leg for the past 2 days.  Has had sciatica in the past but never pain this severe.  Denies any fall or trauma.  States the pain is constant and radiates down her right leg.  She did not take anything for it because "nothing works".  She has not had any back surgery.  The pain is associated with some numbness in her leg.  No weakness.  No bowel or bladder incontinence.  No fever or vomiting.  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  No history of IV drug abuse.  No fevers, chills, nausea or vomiting.  No chest pain or shortness of breath.  No loss of bowel or bladder control.  She is able to ambulate but has severe pain.  States has never been this bad in the past.  The history is provided by the patient.  Back Pain Associated symptoms: no abdominal pain, no dysuria, no fever, no headaches and no weakness        Home Medications Prior to Admission medications   Medication Sig Start Date End Date Taking? Authorizing Provider  acetaminophen (TYLENOL) 500 MG tablet Take 1,000 mg by mouth every 6 (six) hours as needed for mild pain, moderate pain or headache.    [provider]  fluticasone (FLONASE) 50 MCG/ACT nasal spray Place 1 spray into both nostrils daily. Patient taking differently: Place 1 spray into both nostrils daily as needed for rhinitis.  10/11/15   Emily Filbert, MD  HYDROcodone-acetaminophen (HYCET) 7.5-325 mg/15 ml solution Take 10-15 mLs by mouth every 4 (four) hours as needed for moderate pain. 08/05/16   Jerrell Belfast, MD  ibuprofen (ADVIL,MOTRIN) 600 MG tablet Take 1 tablet (600 mg total) by mouth every  6 (six) hours. Patient not taking: Reported on 08/05/2016 08/14/15   Emily Filbert, MD  Olopatadine HCl (PATADAY) 0.2 % SOLN Apply 1 drop to eye daily as needed (Allergies).    [provider]  oxyCODONE (OXY IR/ROXICODONE) 5 MG immediate release tablet Take 5 mg by mouth every 4 (four) hours as needed for moderate pain or severe pain.    [provider]  Prenatal Vit-Fe Fumarate-FA (PRENATAL MULTIVITAMIN) TABS tablet Take 1 tablet by mouth 3 (three) times daily.    [provider]  Vitamin D, Ergocalciferol, (DRISDOL) 50000 units CAPS capsule Take 1 capsule (50,000 Units total) by mouth every 7 (seven) days. 10/11/15   Emily Filbert, MD      Allergies    Prednisone    Review of Systems   Review of Systems  Constitutional:  Negative for activity change, appetite change and fever.  HENT:  Negative for congestion and rhinorrhea.   Respiratory:  Negative for cough, chest tightness and shortness of breath.   Gastrointestinal:  Negative for abdominal pain, nausea and vomiting.  Genitourinary:  Negative for dysuria.  Musculoskeletal:  Positive for arthralgias, back pain and myalgias.  Skin:  Negative for rash.  Neurological:  Negative for dizziness, weakness and headaches.    all other systems are negative except as noted in the HPI and PMH.   Physical  Exam Updated Vital Signs BP (!) 155/109   Pulse (!) 124   Temp 97.7 F (36.5 C) (Oral)   Resp 20   LMP 06/02/2022 (Exact Date)   SpO2 100%  Physical Exam Vitals and nursing note reviewed.  Constitutional:      General: She is not in acute distress.    Appearance: She is well-developed.  HENT:     Head: Normocephalic and atraumatic.     Mouth/Throat:     Pharynx: No oropharyngeal exudate.  Eyes:     Conjunctiva/sclera: Conjunctivae normal.     Pupils: Pupils are equal, round, and reactive to light.  Neck:     Comments: No meningismus. Cardiovascular:     Rate and Rhythm: Regular rhythm. Tachycardia present.      Heart sounds: Normal heart sounds. No murmur heard. Pulmonary:     Effort: Pulmonary effort is normal. No respiratory distress.     Breath sounds: Normal breath sounds.  Abdominal:     Palpations: Abdomen is soft.     Tenderness: There is no abdominal tenderness. There is no guarding or rebound.  Musculoskeletal:        General: Tenderness present. Normal range of motion.     Cervical back: Normal range of motion and neck supple.     Comments: Right paraspinal lumbar tenderness, no midline tenderness  5/5 strength in bilateral lower extremities. Ankle plantar and dorsiflexion intact. Great toe extension intact bilaterally. +2 DP and PT pulses. +2 patellar reflexes bilaterally. Normal gait.   Skin:    General: Skin is warm.  Neurological:     Mental Status: She is alert and oriented to person, place, and time.     Cranial Nerves: No cranial nerve deficit.     Motor: No abnormal muscle tone.     Coordination: Coordination normal.     Comments: No ataxia on finger to nose bilaterally. No pronator drift. 5/5 strength throughout. CN 2-12 intact.Equal grip strength. Sensation intact.   Psychiatric:        Behavior: Behavior normal.     ED Results / Procedures / Treatments   Labs (all labs ordered are listed, but only abnormal results are displayed) Labs Reviewed  URINALYSIS, ROUTINE W REFLEX MICROSCOPIC - Abnormal; Notable for the following components:      Result Value   APPearance HAZY (*)    Specific Gravity, Urine 1.031 (*)    Glucose, UA 100 (*)    Ketones, ur >80 (*)    Protein, ur 30 (*)    Leukocytes,Ua MODERATE (*)    Bacteria, UA MANY (*)    All other components within normal limits  URINE CULTURE  PREGNANCY, URINE  CBC WITH DIFFERENTIAL/PLATELET  BASIC METABOLIC PANEL    EKG None  Radiology CT L-SPINE NO CHARGE  Result Date: 06/23/2022 CLINICAL DATA:  Low back pain EXAM: CT LUMBAR SPINE WITHOUT CONTRAST TECHNIQUE: Multidetector CT imaging of the lumbar  spine was performed without intravenous contrast administration. Multiplanar CT image reconstructions were also generated. RADIATION DOSE REDUCTION: This exam was performed according to the departmental dose-optimization program which includes automated exposure control, adjustment of the mA and/or kV according to patient size and/or use of iterative reconstruction technique. COMPARISON:  None Available. FINDINGS: Segmentation: 5 lumbar type vertebrae. Alignment: Normal. Vertebrae: No acute fracture or focal pathologic process. There is dense sclerotic change of both sacroiliac joints. Paraspinal and other soft tissues: Negative Disc levels: at L4-5 there is a medium size central/left subarticular disc protrusion that causes  narrowing of the left lateral recess. No central spinal canal stenosis. No neural foraminal stenosis. IMPRESSION: 1. No acute fracture or static subluxation of the lumbar spine. 2. Medium size central/left subarticular disc protrusion at L4-5 that causes narrowing of the left lateral recess. Electronically Signed   By: Ulyses Jarred M.D.   On: 06/23/2022 22:39   CT Renal Stone Study  Result Date: 06/23/2022 CLINICAL DATA:  Abdominal and flank pain. Low back pain radiating down right leg. EXAM: CT ABDOMEN AND PELVIS WITHOUT CONTRAST TECHNIQUE: Multidetector CT imaging of the abdomen and pelvis was performed following the standard protocol without IV contrast. RADIATION DOSE REDUCTION: This exam was performed according to the departmental dose-optimization program which includes automated exposure control, adjustment of the mA and/or kV according to patient size and/or use of iterative reconstruction technique. COMPARISON:  None Available. FINDINGS: Lower chest: Clear lung bases. Hepatobiliary: No focal hepatic abnormality on this unenhanced exam. Liver is enlarged spanning 19.7 cm cranial caudal. Nitrogen containing gallstones. No pericholecystic inflammation. No biliary dilatation Pancreas:  No ductal dilatation or inflammation. Spleen: Normal in size without focal abnormality. Adrenals/Urinary Tract: Normal adrenal glands. No hydronephrosis. No renal calculi. No perinephric edema. No evidence of focal renal lesion on this unenhanced exam. Urinary bladder is near completely empty and not well assessed. There is no bladder stone. Stomach/Bowel: Mild gastric distension with ingested contents. There is no bowel obstruction or inflammation. Fecalization of distal small bowel contents. Portions of normal appendix are tentatively visualized. Regardless no appendicitis. Moderate volume of stool in the colon. Sigmoid colon is redundant. No colonic inflammation. Vascular/Lymphatic: Normal caliber abdominal aorta. Minimal bi-iliac atherosclerosis. No suspicious adenopathy. Reproductive: Retroverted uterus. No adnexal mass. Other: Mild rectus diastasis. No ascites. No free air. Musculoskeletal: Lumbar spine assessed on concurrent lumbar spine reformats, reported separately. There is sclerosis about sacroiliac joints without erosive change. IMPRESSION: 1. No renal stones or obstructive uropathy. 2. Cholelithiasis without CT findings of acute cholecystitis. 3. Moderate colonic stool burden with fecalization of distal small bowel contents. 4. Findings suggestive of nonerosive bilateral sacroiliitis. Electronically Signed   By: Keith Rake M.D.   On: 06/23/2022 22:10    Procedures Procedures    Medications Ordered in ED Medications  ketorolac (TORADOL) 30 MG/ML injection 30 mg (has no administration in time range)    ED Course/ Medical Decision Making/ A&P                           Medical Decision Making Amount and/or Complexity of Data Reviewed Labs: ordered. Decision-making details documented in ED Course. Radiology: ordered and independent interpretation performed. Decision-making details documented in ED Course. ECG/medicine tests: ordered and independent interpretation performed.  Decision-making details documented in ED Course.  Risk Prescription drug management.  Right-sided low back pain going down right leg.  Consistent with previous episodes of sciatica.  Intact distal strength, sensation, pulses and reflexes.  Tachycardic on arrival likely secondary to pain.  Low suspicion for cord compression or cauda equina. Suspect sciatica.  Patient states she is allergic to prednisone as it causes flushing.  Does not want any pain medication either or narcotics due to side effects.  She is agreeable to Toradol and Flexeril.  She is tachycardic on arrival.  Borderline temperature of 99.  No midline back pain.  Labs obtained to evaluate for leukocytosis.  Urinalysis is positive with many leukocytes, bacteria and ketones.  We will send culture.  Imaging negative for kidney stone or  other obstructive pathology.  Does show bulging disc on the left without her symptoms currently are on the right.  Low suspicion for cord compression or cauda equina.  Would benefit from outpatient MRI and further evaluation with neurosurgery.  No indication for emergent transfer tonight.  Given her persistent tachycardia, large ketones and poor p.o. intake at home will initiate IV fluids and check labs.  We will treat UTI with Rocephin while culture is pending.  Care to be transferred at shift change.  Anticipate discharge home with treatment for UTI as well as sciatica.        Final Clinical Impression(s) / ED Diagnoses Final diagnoses:  None    Rx / DC Orders ED Discharge Orders     None         Necie Wilcoxson, Annie Main, MD 06/23/22 2337

## 2022-06-23 NOTE — ED Triage Notes (Addendum)
Pt presents with lower back pain radiating down right leg. Pt reports that she "sat wrong" after exercising and pain has gotten worse Denies numbness in legs, denies difficulty using restroom

## 2022-06-23 NOTE — Discharge Instructions (Signed)
Take the steroids and muscle relaxers as prescribed.  As we discussed we suspect you have a bulging disc in your back and you should follow-up with neurosurgery for an MRI.  You also have a urinary tract infection.  Take the antibiotics as prescribed.  Return to the ED sooner with worsening pain, weakness, numbness, tingling, bowel or bladder incontinence, fever or any other concerns.

## 2022-06-24 DIAGNOSIS — M545 Low back pain, unspecified: Secondary | ICD-10-CM | POA: Diagnosis not present

## 2022-06-24 LAB — BASIC METABOLIC PANEL
Anion gap: 9 (ref 5–15)
BUN: 17 mg/dL (ref 6–20)
CO2: 23 mmol/L (ref 22–32)
Calcium: 9.6 mg/dL (ref 8.9–10.3)
Chloride: 102 mmol/L (ref 98–111)
Creatinine, Ser: 0.54 mg/dL (ref 0.44–1.00)
GFR, Estimated: >60 mL/min (ref >60)
Glucose, Bld: 169 mg/dL — ABNORMAL HIGH (ref 70–99)
Potassium: 4.2 mmol/L (ref 3.5–5.1)
Sodium: 134 mmol/L — ABNORMAL LOW (ref 135–145)

## 2022-06-24 MED ORDER — METHOCARBAMOL 500 MG PO TABS: 500.0000 mg | ORAL_TABLET | Freq: Three times a day (TID) | ORAL | 0 refills | Status: DC | PRN

## 2022-06-24 MED ORDER — METHYLPREDNISOLONE 4 MG PO TBPK: ORAL_TABLET | ORAL | 0 refills | Status: DC

## 2022-06-24 MED ORDER — CEPHALEXIN 500 MG PO CAPS: 500.0000 mg | ORAL_CAPSULE | Freq: Three times a day (TID) | ORAL | 0 refills | Status: DC

## 2022-06-26 LAB — URINE CULTURE: Culture: 20000 — AB

## 2022-06-28 ENCOUNTER — Telehealth (HOSPITAL_BASED_OUTPATIENT_CLINIC_OR_DEPARTMENT_OTHER): Payer: Self-pay | Admitting: *Deleted

## 2022-06-28 NOTE — Telephone Encounter (Signed)
Post ED Visit - Positive Culture Follow-up  Culture report reviewed by antimicrobial stewardship pharmacist: [facility] Team '[]'$  Elenor Quinones, Pharm.D. '[]'$  Heide Guile, Pharm.D., BCPS AQ-ID '[]'$  Parks Neptune, Pharm.D., BCPS '[]'$  Alycia Rossetti, Pharm.D., BCPS '[]'$  Berry, Pharm.D., BCPS, AAHIVP '[]'$  Legrand Como, Pharm.D., BCPS, AAHIVP '[]'$  Salome Arnt, PharmD, BCPS '[]'$  Johnnette Gourd, PharmD, BCPS '[]'$  Hughes Better, PharmD, BCPS '[]'$  Leeroy Cha, PharmD '[]'$  Laqueta Linden, PharmD, BCPS '[]'$  Albertina Parr, PharmD  [place] Team '[]'$  Leodis Sias, PharmD '[]'$  Lindell Spar, PharmD '[]'$  Royetta Asal, PharmD '[]'$  Graylin Shiver, Rph '[]'$  Rema Fendt) Glennon Mac, PharmD '[]'$  Arlyn Dunning, PharmD '[]'$  Netta Cedars, PharmD '[]'$  Dia Sitter, PharmD '[]'$  Leone Haven, PharmD '[]'$  Gretta Arab, PharmD '[]'$  Theodis Shove, PharmD '[]'$  Peggyann Juba, PharmD '[]'$  Reuel Boom, PharmD   Positive urine culture Treated with Cephalexin, organism sensitive to the same and no further patient follow-up is required at this time.  Wilson Singer, PharmD  Harlon Flor Talley 06/28/2022, 5:57 AM

## 2022-08-15 ENCOUNTER — Other Ambulatory Visit: Payer: Self-pay | Admitting: Sports Medicine

## 2022-08-15 DIAGNOSIS — M545 Low back pain, unspecified: Secondary | ICD-10-CM

## 2022-08-16 ENCOUNTER — Encounter: Payer: Self-pay | Admitting: Internal Medicine

## 2022-08-16 ENCOUNTER — Ambulatory Visit: Payer: Commercial Managed Care - HMO | Admitting: Internal Medicine

## 2022-08-16 VITALS — BP 145/105 | HR 98 | Ht 69.0 in | Wt 224.0 lb

## 2022-08-16 DIAGNOSIS — I10 Essential (primary) hypertension: Secondary | ICD-10-CM

## 2022-08-16 MED ORDER — AMLODIPINE BESYLATE 5 MG PO TABS: 5.0000 mg | ORAL_TABLET | Freq: Every day | ORAL | 3 refills | Status: AC

## 2022-08-16 NOTE — Progress Notes (Unsigned)
Primary Physician/Referring:  [facility]  Patient ID: Jenny Marshall, female    DOB: 04-15-1976, 47 y.o.   MRN: [id number]  Chief Complaint  Patient presents with   Hypertension   New Patient (Initial Visit)   HPI:    Jenny Marshall  is a 47 y.o. female with past medical history significant for anemia and elevated blood pressure who is here to establish care with cardiology.  Patient's blood pressure has been significantly elevated lately.  She believes this is associated with uncontrolled pain from her sciatica.  She has had severe uncontrolled pain for months now with no relief.  She just recently began to get some pain relief.  She states she has had normal blood pressures prior to this at home and at doctors visits.  Patient is agreeable to starting low-dose blood pressure medication in the interim considering her pain is still severely uncontrolled.  She denies chest pain, shortness of breath, palpitations, diaphoresis, syncope, orthopnea, edema, PND, claudication.  Past Medical History:  Diagnosis Date   Anemia    CHRONIC   Anginal pain ([place])    AGE 33-21; HAD EVAL; NO DX; OCCURS WITH STRSS ONT BLOOD THINNERS AGE 50   Cancer ([place])    squamous cell carcinoma, tongue   Gestational diabetes 2014   current pregnancy   H/O varicella    Irritable bowel    X SEVERAL YEARS;  RESOLVED 2006   Low iron    Obesity    Pregnancy induced hypertension    Shortness of breath    AGE 33-21; WITH STRESS   Varicose veins    Past Surgical History:  Procedure Laterality Date   CESAREAN SECTION N/A 10/05/2012   Procedure: Primary cesarean section with delivery of baby boy 46. Apgars 8/9.;  Surgeon: Alwyn Pea, MD;  Location: [facility];  Service: Obstetrics;  Laterality: N/A;   PERINEAL LACERATION REPAIR N/A 06/15/2015   Procedure: Revision of Female Cirumcision and Perineal Scar Revision;  Surgeon: Emily Filbert, MD;  Location: [facility];  Service: Gynecology;  Laterality:  N/A;  Requested 06/15/15 @ 1:30p   VULVA SURGERY  at age 89   clitorectomy   WOUND EXPLORATION N/A 08/05/2016   Procedure: CONTROL OF Maggie Font;  Surgeon: Jerrell Belfast, MD;  Location: [facility];  Service: ENT;  Laterality: N/A;   Family History  Problem Relation Age of Onset   Hypertension Father    Hyperlipidemia Paternal Aunt    Diabetes Paternal Aunt    Hypertension Paternal Aunt    Kidney disease Paternal Aunt    Hyperlipidemia Paternal Uncle    Diabetes Paternal Uncle    Hypertension Paternal Uncle    Stroke Paternal Uncle    Rheum arthritis Paternal Uncle    Arthritis Maternal Grandmother    Diabetes Maternal Grandmother    Hyperlipidemia Maternal Grandmother    Other Maternal Grandmother        VARICOSE VEINS   Heart disease Maternal Grandfather    Vision loss Paternal Grandfather        GLAUCOMA    Social History   Tobacco Use   Smoking status: Never   Smokeless tobacco: Never  Substance Use Topics   Alcohol use: No    Alcohol/week: 0.0 standard drinks of alcohol   Marital Status: Married  ROS  Review of Systems  Musculoskeletal:  Positive for joint pain.   Objective  Blood pressure (!) 145/105, pulse 98, height '5\' 9"'$  (1.753 m), weight 224 lb (101.6  kg), SpO2 98 %, currently breastfeeding. Body mass index is 33.08 kg/m.     08/16/2022   10:04 AM 08/16/2022    9:57 AM 06/24/2022    1:30 AM  Vitals with BMI  Height  '5\' 9"'$    Weight  224 lbs   BMI  93.90   Systolic 300 923 300  Diastolic 762 263 335  Pulse 98 100 80     Physical Exam Vitals reviewed.  HENT:     Head: Normocephalic and atraumatic.  Cardiovascular:     Rate and Rhythm: Normal rate and regular rhythm.     Pulses: Normal pulses.     Heart sounds: Normal heart sounds. No murmur heard. Pulmonary:     Effort: Pulmonary effort is normal.     Breath sounds: Normal breath sounds.  Abdominal:     General: Bowel sounds are normal.  Musculoskeletal:     Right lower leg: No edema.     Left  lower leg: No edema.  Skin:    General: Skin is warm and dry.  Neurological:     Mental Status: She is alert.     Medications and allergies   Allergies  Allergen Reactions   Prednisone Other (See Comments)    PRICKLY FEELING;      Medication list after today's encounter   Current Outpatient Medications:    amLODipine (NORVASC) 5 MG tablet, Take 1 tablet (5 mg total) by mouth daily., Disp: 90 tablet, Rfl: 3   cholecalciferol (VITAMIN D3) 25 MCG (1000 UNIT) tablet, Take 1,000 Units by mouth daily. 10,000 units weekly, Disp: , Rfl:    cyclobenzaprine (FLEXERIL) 10 MG tablet, Take 10 mg by mouth at bedtime as needed., Disp: , Rfl:    glyBURIDE (DIABETA) 5 MG tablet, Take 5 mg by mouth every morning., Disp: , Rfl:    magnesium 30 MG tablet, Take 30 mg by mouth 2 (two) times daily., Disp: , Rfl:    meloxicam (MOBIC) 15 MG tablet, Take 15 mg by mouth daily as needed., Disp: , Rfl:    metoprolol succinate (TOPROL-XL) 25 MG 24 hr tablet, Take 25 mg by mouth daily., Disp: , Rfl:    Zinc Sulfate (ZINC 15 PO), Take by mouth., Disp: , Rfl:   Laboratory examination:   Lab Results  Component Value Date   NA 134 (L) 06/23/2022   K 4.2 06/23/2022   CO2 23 06/23/2022   GLUCOSE 169 (H) 06/23/2022   BUN 17 06/23/2022   CREATININE 0.54 06/23/2022   CALCIUM 9.6 06/23/2022   GFRNONAA >60 06/23/2022       Latest Ref Rng & Units 06/23/2022   11:45 PM 08/05/2016    4:34 AM 06/10/2016   10:22 AM  CMP  Glucose 70 - 99 mg/dL 169  151  138   BUN 6 - 20 mg/dL '17  12  15   '$ Creatinine 0.44 - 1.00 mg/dL 0.54  0.56  0.57   Sodium 135 - 145 mmol/L 134  136  137   Potassium 3.5 - 5.1 mmol/L 4.2  3.5  3.6   Chloride 98 - 111 mmol/L 102  103  106   CO2 22 - 32 mmol/L '23  24  25   '$ Calcium 8.9 - 10.3 mg/dL 9.6  8.9  9.1   Total Protein 6.5 - 8.1 g/dL  8.1  8.4   Total Bilirubin 0.3 - 1.2 mg/dL  0.7  0.4   Alkaline Phos 38 - 126 U/L  66  52  AST 15 - 41 U/L  29  22   ALT 14 - 54 U/L  41  15        Latest Ref Rng & Units 06/23/2022   11:45 PM 08/05/2016    4:34 AM 06/10/2016   10:22 AM  CBC  WBC 4.0 - 10.5 K/uL 6.5  6.6  4.9   Hemoglobin 12.0 - 15.0 g/dL 15.0  13.1  12.2   Hematocrit 36.0 - 46.0 % 43.5  39.2  37.0   Platelets 150 - 400 K/uL 221  313  263     Lipid Panel No results for input(s): "CHOL", "TRIG", "LDLCALC", "VLDL", "HDL", "CHOLHDL", "LDLDIRECT" in the last 8760 hours.  HEMOGLOBIN A1C No results found for: "HGBA1C", "MPG" TSH No results for input(s): "TSH" in the last 8760 hours.  External labs:     Radiology:    Cardiac Studies:     EKG:   08/16/2022: sinus rhythm with PACs  Assessment     ICD-10-CM   1. Hypertension, unspecified type  I10 EKG 12-Lead    2. Uncontrolled hypertension  I10 PCV ECHOCARDIOGRAM COMPLETE       Orders Placed This Encounter  Procedures   EKG 12-Lead   PCV ECHOCARDIOGRAM COMPLETE    Standing Status:   Future    Standing Expiration Date:   08/17/2023    Meds ordered this encounter  Medications   amLODipine (NORVASC) 5 MG tablet    Sig: Take 1 tablet (5 mg total) by mouth daily.    Dispense:  90 tablet    Refill:  3    Medications Discontinued During This Encounter  Medication Reason   cephALEXin (KEFLEX) 500 MG capsule Completed Course   acetaminophen (TYLENOL) 500 MG tablet Completed Course   fluticasone (FLONASE) 50 MCG/ACT nasal spray Completed Course   HYDROcodone-acetaminophen (HYCET) 7.5-325 mg/15 ml solution Completed Course   ibuprofen (ADVIL,MOTRIN) 600 MG tablet Completed Course   methocarbamol (ROBAXIN) 500 MG tablet Completed Course   methylPREDNISolone (MEDROL DOSEPAK) 4 MG TBPK tablet Completed Course   Olopatadine HCl (PATADAY) 0.2 % SOLN Completed Course   oxyCODONE (OXY IR/ROXICODONE) 5 MG immediate release tablet Completed Course   Prenatal Vit-Fe Fumarate-FA (PRENATAL MULTIVITAMIN) TABS tablet Completed Course   Vitamin D, Ergocalciferol, (DRISDOL) 50000 units CAPS capsule Completed Course      Recommendations:   Jenny Marshall is a 47 y.o.  female with high blood pressure    Uncontrolled hypertension BP significantly elevated Patient attributes this to sciatica pain She has never had high BP in the past Will still initiate anti-hypertensive's as she has been in pain for months now Echocardiogram ordered Follow-up in 1-2 month or sooner if needed     Jenny Flock, DO, [facility]  08/17/2022, 3:39 PM Office: [phone] Pager: [phone]

## 2022-08-20 ENCOUNTER — Ambulatory Visit: Payer: Commercial Managed Care - HMO

## 2022-08-20 DIAGNOSIS — I10 Essential (primary) hypertension: Secondary | ICD-10-CM

## 2022-08-21 NOTE — Progress Notes (Signed)
Called patient to inform her about her echo. Patient understood

## 2022-08-21 NOTE — Progress Notes (Signed)
normal

## 2022-09-04 ENCOUNTER — Ambulatory Visit
Admission: RE | Admit: 2022-09-04 | Discharge: 2022-09-04 | Disposition: A | Discharge status: Home / Self Care | Payer: Commercial Managed Care - HMO | Source: Ambulatory Visit | Attending: Sports Medicine

## 2022-09-04 DIAGNOSIS — M545 Low back pain, unspecified: Secondary | ICD-10-CM

## 2022-09-26 ENCOUNTER — Ambulatory Visit: Payer: Commercial Managed Care - HMO | Admitting: Internal Medicine

## 2022-09-26 NOTE — Progress Notes (Signed)
No show

## 2022-09-30 ENCOUNTER — Encounter: Payer: Self-pay | Admitting: Internal Medicine

## 2022-09-30 ENCOUNTER — Ambulatory Visit: Payer: Commercial Managed Care - HMO | Admitting: Internal Medicine

## 2022-09-30 VITALS — BP 133/94 | HR 100 | Ht 69.0 in | Wt 230.0 lb

## 2022-09-30 DIAGNOSIS — I10 Essential (primary) hypertension: Secondary | ICD-10-CM

## 2022-09-30 DIAGNOSIS — E119 Type 2 diabetes mellitus without complications: Secondary | ICD-10-CM

## 2022-09-30 NOTE — Progress Notes (Signed)
Primary Physician/Referring:  [facility]  Patient ID: Jenny Marshall, female    DOB: 19-Dec-1975, 47 y.o.   MRN: [id number]  Chief Complaint  Patient presents with   Hypertension   Follow-up   Results   HPI:    Jenny Marshall  is a 47 y.o. female with past medical history significant for anemia and elevated blood pressure who is here for a follow-up visit. She has been doing well since our last visit. Her pain is a little better controlled now she got an injection in her joint and it has helped. Patient checks her BP regularly and she will continue to do so while she is fasting for Ramadan. She denies chest pain, shortness of breath, palpitations, diaphoresis, syncope, orthopnea, edema, PND, claudication.  Past Medical History:  Diagnosis Date   Anemia    CHRONIC   Anginal pain ([place])    AGE 35-21; HAD EVAL; NO DX; OCCURS WITH STRSS ONT BLOOD THINNERS AGE 30   Cancer ([place])    squamous cell carcinoma, tongue   Gestational diabetes 2014   current pregnancy   H/O varicella    Irritable bowel    X SEVERAL YEARS;  RESOLVED 2006   Low iron    Obesity    Pregnancy induced hypertension    Shortness of breath    AGE 35-21; WITH STRESS   Varicose veins    Past Surgical History:  Procedure Laterality Date   CESAREAN SECTION N/A 10/05/2012   Procedure: Primary cesarean section with delivery of baby boy 59. Apgars 8/9.;  Surgeon: Alwyn Pea, MD;  Location: [facility];  Service: Obstetrics;  Laterality: N/A;   PERINEAL LACERATION REPAIR N/A 06/15/2015   Procedure: Revision of Female Cirumcision and Perineal Scar Revision;  Surgeon: Emily Filbert, MD;  Location: [facility];  Service: Gynecology;  Laterality: N/A;  Requested 06/15/15 @ 1:30p   VULVA SURGERY  at age 82   clitorectomy   WOUND EXPLORATION N/A 08/05/2016   Procedure: CONTROL OF Maggie Font;  Surgeon: Jerrell Belfast, MD;  Location: [facility];  Service: ENT;  Laterality: N/A;   Family History  Problem Relation  Age of Onset   Hypertension Father    Hyperlipidemia Paternal Aunt    Diabetes Paternal Aunt    Hypertension Paternal Aunt    Kidney disease Paternal Aunt    Hyperlipidemia Paternal Uncle    Diabetes Paternal Uncle    Hypertension Paternal Uncle    Stroke Paternal Uncle    Rheum arthritis Paternal Uncle    Arthritis Maternal Grandmother    Diabetes Maternal Grandmother    Hyperlipidemia Maternal Grandmother    Other Maternal Grandmother        VARICOSE VEINS   Heart disease Maternal Grandfather    Vision loss Paternal Grandfather        GLAUCOMA    Social History   Tobacco Use   Smoking status: Never   Smokeless tobacco: Never  Substance Use Topics   Alcohol use: No    Alcohol/week: 0.0 standard drinks of alcohol   Marital Status: Married  ROS  Review of Systems  Musculoskeletal:  Positive for joint pain.   Objective  Blood pressure (!) 133/94, pulse 100, height '5\' 9"'$  (1.753 m), weight 230 lb (104.3 kg), SpO2 100 %, currently breastfeeding. Body mass index is 33.97 kg/m.     09/30/2022   11:27 AM 09/30/2022   11:21 AM 08/16/2022   10:04 AM  Vitals with BMI  Height  $'5\' 9"'R$    Weight  230 lbs   BMI  99991111   Systolic Q000111Q 0000000 Q000111Q  Diastolic 94 [id number]  Pulse 100 99 98     Physical Exam Vitals reviewed.  HENT:     Head: Normocephalic and atraumatic.  Cardiovascular:     Rate and Rhythm: Normal rate and regular rhythm.     Pulses: Normal pulses.     Heart sounds: Normal heart sounds. No murmur heard. Pulmonary:     Effort: Pulmonary effort is normal.     Breath sounds: Normal breath sounds.  Abdominal:     General: Bowel sounds are normal.  Musculoskeletal:     Right lower leg: No edema.     Left lower leg: No edema.  Skin:    General: Skin is warm and dry.  Neurological:     Mental Status: She is alert.     Medications and allergies   Allergies  Allergen Reactions   Prednisone Other (See Comments)    PRICKLY FEELING;      Medication list after  today's encounter   Current Outpatient Medications:    amLODipine (NORVASC) 5 MG tablet, Take 1 tablet (5 mg total) by mouth daily., Disp: 90 tablet, Rfl: 3   cholecalciferol (VITAMIN D3) 25 MCG (1000 UNIT) tablet, Take 1,000 Units by mouth daily. 10,000 units weekly, Disp: , Rfl:    ferrous sulfate 325 (65 FE) MG tablet, Take 325 mg by mouth daily with breakfast., Disp: , Rfl:    glyBURIDE (DIABETA) 5 MG tablet, Take 5 mg by mouth every morning., Disp: , Rfl:    metoprolol succinate (TOPROL-XL) 25 MG 24 hr tablet, Take 25 mg by mouth daily., Disp: , Rfl:    Zinc Sulfate (ZINC 15 PO), Take by mouth., Disp: , Rfl:   Laboratory examination:   Lab Results  Component Value Date   NA 134 (L) 06/23/2022   K 4.2 06/23/2022   CO2 23 06/23/2022   GLUCOSE 169 (H) 06/23/2022   BUN 17 06/23/2022   CREATININE 0.54 06/23/2022   CALCIUM 9.6 06/23/2022   GFRNONAA >60 06/23/2022       Latest Ref Rng & Units 06/23/2022   11:45 PM 08/05/2016    4:34 AM 06/10/2016   10:22 AM  CMP  Glucose 70 - 99 mg/dL 169  151  138   BUN 6 - 20 mg/dL '17  12  15   '$ Creatinine 0.44 - 1.00 mg/dL 0.54  0.56  0.57   Sodium 135 - 145 mmol/L 134  136  137   Potassium 3.5 - 5.1 mmol/L 4.2  3.5  3.6   Chloride 98 - 111 mmol/L 102  103  106   CO2 22 - 32 mmol/L '23  24  25   '$ Calcium 8.9 - 10.3 mg/dL 9.6  8.9  9.1   Total Protein 6.5 - 8.1 g/dL  8.1  8.4   Total Bilirubin 0.3 - 1.2 mg/dL  0.7  0.4   Alkaline Phos 38 - 126 U/L  66  52   AST 15 - 41 U/L  29  22   ALT 14 - 54 U/L  41  15       Latest Ref Rng & Units 06/23/2022   11:45 PM 08/05/2016    4:34 AM 06/10/2016   10:22 AM  CBC  WBC 4.0 - 10.5 K/uL 6.5  6.6  4.9   Hemoglobin 12.0 - 15.0 g/dL 15.0  13.1  12.2   Hematocrit  36.0 - 46.0 % 43.5  39.2  37.0   Platelets 150 - 400 K/uL 221  313  263     Lipid Panel No results for input(s): "CHOL", "TRIG", "LDLCALC", "VLDL", "HDL", "CHOLHDL", "LDLDIRECT" in the last 8760 hours.  HEMOGLOBIN A1C No results found for:  "HGBA1C", "MPG" TSH No results for input(s): "TSH" in the last 8760 hours.  External labs:     Radiology:    Cardiac Studies:   Echocardiogram 08/20/2022: Normal LV systolic function with visual EF 60-65%. Left ventricle cavity is normal in size. Hypertrophic cardiomyopathy. Mild concentric hypertrophy of the left ventricle. Normal global wall motion. Normal diastolic filling pattern, normal LAP. Calculated EF 74%. Left atrial cavity is mildly dilated. Structurally normal tricuspid valve.  Mild tricuspid regurgitation. No evidence of pulmonary hypertension. No prior available for comparison.     EKG:   08/16/2022: sinus rhythm with PACs  Assessment     ICD-10-CM   1. Essential hypertension  I10     2. Type 2 diabetes mellitus without complication, without long-term current use of insulin (HCC)  E11.9        No orders of the defined types were placed in this encounter.   No orders of the defined types were placed in this encounter.   Medications Discontinued During This Encounter  Medication Reason   cyclobenzaprine (FLEXERIL) 10 MG tablet Completed Course   magnesium 30 MG tablet Completed Course   meloxicam (MOBIC) 15 MG tablet Completed Course     Recommendations:   Carlisle Devane is a 47 y.o.  female with high blood pressure   Essential hypertension BP better controlled today, still in some pain Continue current cardiac medications. Encourage low-sodium diet, less than 2000 mg daily. Echocardiogram reviewed, there is mild LVH Follow-up in 6 month or sooner if needed   Type 2 diabetes mellitus without complication, without long-term current use of insulin ([place]) Continue glyburide      Floydene Flock, DO, [facility]  09/30/2022, 11:32 AM Office: [phone] Pager: [phone]

## 2023-03-31 ENCOUNTER — Ambulatory Visit: Payer: Commercial Managed Care - HMO | Admitting: Cardiology

## 2023-08-27 ENCOUNTER — Other Ambulatory Visit: Payer: Self-pay | Admitting: Internal Medicine

## 2023-09-09 DIAGNOSIS — R140 Abdominal distension (gaseous): Secondary | ICD-10-CM | POA: Diagnosis not present

## 2023-09-09 DIAGNOSIS — R160 Hepatomegaly, not elsewhere classified: Secondary | ICD-10-CM | POA: Diagnosis not present

## 2023-09-09 DIAGNOSIS — I10 Essential (primary) hypertension: Secondary | ICD-10-CM | POA: Diagnosis not present

## 2023-09-09 DIAGNOSIS — M5116 Intervertebral disc disorders with radiculopathy, lumbar region: Secondary | ICD-10-CM | POA: Diagnosis not present

## 2023-09-09 DIAGNOSIS — E1159 Type 2 diabetes mellitus with other circulatory complications: Secondary | ICD-10-CM | POA: Diagnosis not present

## 2023-09-16 ENCOUNTER — Other Ambulatory Visit: Payer: Self-pay | Admitting: Physician Assistant

## 2023-09-16 DIAGNOSIS — L989 Disorder of the skin and subcutaneous tissue, unspecified: Secondary | ICD-10-CM | POA: Diagnosis not present

## 2023-09-16 DIAGNOSIS — Z01419 Encounter for gynecological examination (general) (routine) without abnormal findings: Secondary | ICD-10-CM | POA: Diagnosis not present

## 2023-09-16 DIAGNOSIS — Z1231 Encounter for screening mammogram for malignant neoplasm of breast: Secondary | ICD-10-CM

## 2023-09-16 DIAGNOSIS — Z124 Encounter for screening for malignant neoplasm of cervix: Secondary | ICD-10-CM | POA: Diagnosis not present

## 2023-09-28 DIAGNOSIS — Z1212 Encounter for screening for malignant neoplasm of rectum: Secondary | ICD-10-CM | POA: Diagnosis not present

## 2023-09-28 DIAGNOSIS — Z1211 Encounter for screening for malignant neoplasm of colon: Secondary | ICD-10-CM | POA: Diagnosis not present

## 2023-10-06 DIAGNOSIS — J069 Acute upper respiratory infection, unspecified: Secondary | ICD-10-CM | POA: Diagnosis not present

## 2023-10-09 ENCOUNTER — Ambulatory Visit
Admission: RE | Admit: 2023-10-09 | Discharge: 2023-10-09 | Disposition: A | Discharge status: Home / Self Care | Payer: Commercial Managed Care - HMO | Source: Ambulatory Visit | Attending: Physician Assistant | Admitting: Physician Assistant

## 2023-10-09 DIAGNOSIS — Z1231 Encounter for screening mammogram for malignant neoplasm of breast: Secondary | ICD-10-CM | POA: Diagnosis not present

## 2023-10-15 ENCOUNTER — Other Ambulatory Visit: Payer: Self-pay | Admitting: Physician Assistant

## 2023-10-15 DIAGNOSIS — R928 Other abnormal and inconclusive findings on diagnostic imaging of breast: Secondary | ICD-10-CM

## 2023-10-27 ENCOUNTER — Ambulatory Visit
Admission: RE | Admit: 2023-10-27 | Discharge: 2023-10-27 | Disposition: A | Discharge status: Home / Self Care | Source: Ambulatory Visit | Attending: Physician Assistant | Admitting: Physician Assistant

## 2023-10-27 DIAGNOSIS — R928 Other abnormal and inconclusive findings on diagnostic imaging of breast: Secondary | ICD-10-CM | POA: Diagnosis not present

## 2023-11-07 DIAGNOSIS — K8020 Calculus of gallbladder without cholecystitis without obstruction: Secondary | ICD-10-CM | POA: Diagnosis not present

## 2023-11-07 DIAGNOSIS — R160 Hepatomegaly, not elsewhere classified: Secondary | ICD-10-CM | POA: Diagnosis not present

## 2023-11-17 DIAGNOSIS — M25473 Effusion, unspecified ankle: Secondary | ICD-10-CM | POA: Diagnosis not present

## 2023-11-17 DIAGNOSIS — E559 Vitamin D deficiency, unspecified: Secondary | ICD-10-CM | POA: Diagnosis not present

## 2023-11-17 DIAGNOSIS — R21 Rash and other nonspecific skin eruption: Secondary | ICD-10-CM | POA: Diagnosis not present

## 2023-11-19 DIAGNOSIS — E559 Vitamin D deficiency, unspecified: Secondary | ICD-10-CM | POA: Diagnosis not present

## 2024-03-03 ENCOUNTER — Telehealth: Payer: Self-pay

## 2024-03-03 DIAGNOSIS — M25572 Pain in left ankle and joints of left foot: Secondary | ICD-10-CM | POA: Diagnosis not present

## 2024-03-03 NOTE — Telephone Encounter (Signed)
 Patient was identified as falling into the [place] Measure - Diabetes.   Patient was: Patient is not currently using our practice.

## 2024-03-08 DIAGNOSIS — M25572 Pain in left ankle and joints of left foot: Secondary | ICD-10-CM | POA: Diagnosis not present

## 2024-03-08 DIAGNOSIS — M7662 Achilles tendinitis, left leg: Secondary | ICD-10-CM | POA: Diagnosis not present

## 2024-03-15 DIAGNOSIS — M7662 Achilles tendinitis, left leg: Secondary | ICD-10-CM | POA: Diagnosis not present

## 2024-03-15 DIAGNOSIS — M25572 Pain in left ankle and joints of left foot: Secondary | ICD-10-CM | POA: Diagnosis not present

## 2024-03-18 DIAGNOSIS — M7662 Achilles tendinitis, left leg: Secondary | ICD-10-CM | POA: Diagnosis not present

## 2024-03-18 DIAGNOSIS — M25572 Pain in left ankle and joints of left foot: Secondary | ICD-10-CM | POA: Diagnosis not present

## 2024-03-22 DIAGNOSIS — M25572 Pain in left ankle and joints of left foot: Secondary | ICD-10-CM | POA: Diagnosis not present

## 2024-03-22 DIAGNOSIS — M7662 Achilles tendinitis, left leg: Secondary | ICD-10-CM | POA: Diagnosis not present

## 2024-03-25 DIAGNOSIS — M7662 Achilles tendinitis, left leg: Secondary | ICD-10-CM | POA: Diagnosis not present

## 2024-03-25 DIAGNOSIS — M25572 Pain in left ankle and joints of left foot: Secondary | ICD-10-CM | POA: Diagnosis not present

## 2024-03-29 DIAGNOSIS — M25572 Pain in left ankle and joints of left foot: Secondary | ICD-10-CM | POA: Diagnosis not present

## 2024-03-29 DIAGNOSIS — M7662 Achilles tendinitis, left leg: Secondary | ICD-10-CM | POA: Diagnosis not present

## 2024-03-31 DIAGNOSIS — M25572 Pain in left ankle and joints of left foot: Secondary | ICD-10-CM | POA: Diagnosis not present

## 2024-03-31 DIAGNOSIS — M7662 Achilles tendinitis, left leg: Secondary | ICD-10-CM | POA: Diagnosis not present

## 2024-04-06 DIAGNOSIS — M7662 Achilles tendinitis, left leg: Secondary | ICD-10-CM | POA: Diagnosis not present

## 2024-04-06 DIAGNOSIS — M25572 Pain in left ankle and joints of left foot: Secondary | ICD-10-CM | POA: Diagnosis not present

## 2024-04-12 DIAGNOSIS — M25572 Pain in left ankle and joints of left foot: Secondary | ICD-10-CM | POA: Diagnosis not present

## 2024-04-12 DIAGNOSIS — M7662 Achilles tendinitis, left leg: Secondary | ICD-10-CM | POA: Diagnosis not present

## 2024-04-15 DIAGNOSIS — M7662 Achilles tendinitis, left leg: Secondary | ICD-10-CM | POA: Diagnosis not present

## 2024-04-15 DIAGNOSIS — M25572 Pain in left ankle and joints of left foot: Secondary | ICD-10-CM | POA: Diagnosis not present

## 2024-04-19 DIAGNOSIS — M25572 Pain in left ankle and joints of left foot: Secondary | ICD-10-CM | POA: Diagnosis not present

## 2024-04-19 DIAGNOSIS — M7662 Achilles tendinitis, left leg: Secondary | ICD-10-CM | POA: Diagnosis not present

## 2024-04-22 DIAGNOSIS — M25572 Pain in left ankle and joints of left foot: Secondary | ICD-10-CM | POA: Diagnosis not present

## 2024-04-22 DIAGNOSIS — M7662 Achilles tendinitis, left leg: Secondary | ICD-10-CM | POA: Diagnosis not present

## 2024-04-26 DIAGNOSIS — M25572 Pain in left ankle and joints of left foot: Secondary | ICD-10-CM | POA: Diagnosis not present

## 2024-04-26 DIAGNOSIS — M7662 Achilles tendinitis, left leg: Secondary | ICD-10-CM | POA: Diagnosis not present

## 2024-04-29 DIAGNOSIS — M25572 Pain in left ankle and joints of left foot: Secondary | ICD-10-CM | POA: Diagnosis not present

## 2024-04-29 DIAGNOSIS — M7662 Achilles tendinitis, left leg: Secondary | ICD-10-CM | POA: Diagnosis not present

## 2024-05-03 DIAGNOSIS — M25572 Pain in left ankle and joints of left foot: Secondary | ICD-10-CM | POA: Diagnosis not present

## 2024-05-03 DIAGNOSIS — M7662 Achilles tendinitis, left leg: Secondary | ICD-10-CM | POA: Diagnosis not present

## 2024-05-06 DIAGNOSIS — M7662 Achilles tendinitis, left leg: Secondary | ICD-10-CM | POA: Diagnosis not present

## 2024-05-06 DIAGNOSIS — M25572 Pain in left ankle and joints of left foot: Secondary | ICD-10-CM | POA: Diagnosis not present

## 2024-05-10 DIAGNOSIS — M7662 Achilles tendinitis, left leg: Secondary | ICD-10-CM | POA: Diagnosis not present

## 2024-05-10 DIAGNOSIS — M25572 Pain in left ankle and joints of left foot: Secondary | ICD-10-CM | POA: Diagnosis not present

## 2024-05-13 DIAGNOSIS — M25572 Pain in left ankle and joints of left foot: Secondary | ICD-10-CM | POA: Diagnosis not present

## 2024-05-13 DIAGNOSIS — M7662 Achilles tendinitis, left leg: Secondary | ICD-10-CM | POA: Diagnosis not present

## 2024-05-17 DIAGNOSIS — M25572 Pain in left ankle and joints of left foot: Secondary | ICD-10-CM | POA: Diagnosis not present

## 2024-05-17 DIAGNOSIS — M7662 Achilles tendinitis, left leg: Secondary | ICD-10-CM | POA: Diagnosis not present

## 2024-05-27 DIAGNOSIS — M7662 Achilles tendinitis, left leg: Secondary | ICD-10-CM | POA: Diagnosis not present

## 2024-05-27 DIAGNOSIS — M25572 Pain in left ankle and joints of left foot: Secondary | ICD-10-CM | POA: Diagnosis not present

## 2024-05-27 DIAGNOSIS — M7660 Achilles tendinitis, unspecified leg: Secondary | ICD-10-CM | POA: Diagnosis not present

## 2024-05-31 DIAGNOSIS — M7662 Achilles tendinitis, left leg: Secondary | ICD-10-CM | POA: Diagnosis not present

## 2024-05-31 DIAGNOSIS — M25572 Pain in left ankle and joints of left foot: Secondary | ICD-10-CM | POA: Diagnosis not present

## 2024-06-14 DIAGNOSIS — M25572 Pain in left ankle and joints of left foot: Secondary | ICD-10-CM | POA: Diagnosis not present

## 2024-06-14 DIAGNOSIS — M7662 Achilles tendinitis, left leg: Secondary | ICD-10-CM | POA: Diagnosis not present

## 2024-06-21 DIAGNOSIS — M7662 Achilles tendinitis, left leg: Secondary | ICD-10-CM | POA: Diagnosis not present

## 2024-06-21 DIAGNOSIS — M25572 Pain in left ankle and joints of left foot: Secondary | ICD-10-CM | POA: Diagnosis not present

## 2024-07-05 DIAGNOSIS — M7662 Achilles tendinitis, left leg: Secondary | ICD-10-CM | POA: Diagnosis not present

## 2024-07-05 DIAGNOSIS — M25572 Pain in left ankle and joints of left foot: Secondary | ICD-10-CM | POA: Diagnosis not present

## 2024-07-12 DIAGNOSIS — M25572 Pain in left ankle and joints of left foot: Secondary | ICD-10-CM | POA: Diagnosis not present

## 2024-07-19 DIAGNOSIS — M25572 Pain in left ankle and joints of left foot: Secondary | ICD-10-CM | POA: Diagnosis not present

## 2024-07-19 DIAGNOSIS — M7662 Achilles tendinitis, left leg: Secondary | ICD-10-CM | POA: Diagnosis not present

## 2024-07-22 DIAGNOSIS — M7662 Achilles tendinitis, left leg: Secondary | ICD-10-CM | POA: Diagnosis not present

## 2024-08-19 NOTE — Progress Notes (Signed)
 Jenny Marshall                                          MRN: [id number]   08/19/2024   The [facility] Quality Team Specialist reviewed this patient medical record for the purposes of chart review for care gap closure. The following were reviewed: chart review for care gap closure-glycemic status assessment.    [facility] Quality Team
